# Patient Record
Sex: Male | Born: 1960 | Race: White | Hispanic: No | Smoking: Current every day smoker
Health system: Southern US, Community
[De-identification: ages and names within clinical notes are randomized; demographics above are authoritative.]

## PROBLEM LIST (undated history)

## (undated) DIAGNOSIS — J449 Chronic obstructive pulmonary disease, unspecified: Secondary | ICD-10-CM

## (undated) DIAGNOSIS — E162 Hypoglycemia, unspecified: Secondary | ICD-10-CM

## (undated) HISTORY — PX: TONSILLECTOMY: SUR1361

## (undated) HISTORY — PX: KNEE SURGERY: SHX244

## (undated) HISTORY — DX: Chronic obstructive pulmonary disease, unspecified: J44.9

## (undated) HISTORY — PX: BACK SURGERY: SHX140

---

## 2006-07-31 ENCOUNTER — Emergency Department: Payer: Self-pay | Admitting: Emergency Medicine

## 2008-06-18 ENCOUNTER — Emergency Department: Payer: Self-pay | Admitting: Emergency Medicine

## 2010-07-17 ENCOUNTER — Emergency Department: Payer: Self-pay | Admitting: Emergency Medicine

## 2013-02-23 ENCOUNTER — Emergency Department: Payer: Self-pay | Admitting: Emergency Medicine

## 2014-10-17 ENCOUNTER — Emergency Department
Admit: 2014-10-17 | Disposition: A | Discharge status: Home / Self Care | Payer: Self-pay | Admitting: Emergency Medicine

## 2015-07-26 ENCOUNTER — Emergency Department: Payer: Self-pay

## 2015-07-26 ENCOUNTER — Emergency Department
Admission: EM | Admit: 2015-07-26 | Discharge: 2015-07-26 | Disposition: A | Discharge status: Home / Self Care | Payer: Self-pay | Attending: Emergency Medicine | Admitting: Emergency Medicine

## 2015-07-26 ENCOUNTER — Encounter: Payer: Self-pay | Admitting: Emergency Medicine

## 2015-07-26 DIAGNOSIS — J0100 Acute maxillary sinusitis, unspecified: Secondary | ICD-10-CM | POA: Insufficient documentation

## 2015-07-26 DIAGNOSIS — Z720 Tobacco use: Secondary | ICD-10-CM

## 2015-07-26 DIAGNOSIS — J209 Acute bronchitis, unspecified: Secondary | ICD-10-CM | POA: Insufficient documentation

## 2015-07-26 DIAGNOSIS — Z880 Allergy status to penicillin: Secondary | ICD-10-CM | POA: Insufficient documentation

## 2015-07-26 DIAGNOSIS — F17200 Nicotine dependence, unspecified, uncomplicated: Secondary | ICD-10-CM | POA: Insufficient documentation

## 2015-07-26 LAB — CBC WITH DIFFERENTIAL/PLATELET
Basophils Absolute: 0 10*3/uL (ref 0–0.1)
Basophils Relative: 1 %
EOS ABS: 0.1 10*3/uL (ref 0–0.7)
Eosinophils Relative: 2 %
HEMATOCRIT: 45.6 % (ref 40.0–52.0)
HEMOGLOBIN: 15.1 g/dL (ref 13.0–18.0)
LYMPHS ABS: 1.2 10*3/uL (ref 1.0–3.6)
LYMPHS PCT: 18 %
MCH: 31.4 pg (ref 26.0–34.0)
MCHC: 33.2 g/dL (ref 32.0–36.0)
MCV: 94.8 fL (ref 80.0–100.0)
MONOS PCT: 13 %
Monocytes Absolute: 0.8 10*3/uL (ref 0.2–1.0)
NEUTROS ABS: 4.4 10*3/uL (ref 1.4–6.5)
NEUTROS PCT: 66 %
Platelets: 330 10*3/uL (ref 150–440)
RBC: 4.81 MIL/uL (ref 4.40–5.90)
RDW: 12.7 % (ref 11.5–14.5)
WBC: 6.5 10*3/uL (ref 3.8–10.6)

## 2015-07-26 MED ORDER — FLUTICASONE PROPIONATE 50 MCG/ACT NA SUSP: 1.0000 | Freq: Two times a day (BID) | NASAL | Status: DC

## 2015-07-26 MED ORDER — ALBUTEROL SULFATE HFA 108 (90 BASE) MCG/ACT IN AERS: 2.0000 | INHALATION_SPRAY | Freq: Four times a day (QID) | RESPIRATORY_TRACT | Status: DC | PRN

## 2015-07-26 MED ORDER — AZITHROMYCIN 250 MG PO TABS
ORAL_TABLET | ORAL | Status: AC
Start: 2015-07-26 — End: 2015-07-31

## 2015-07-26 NOTE — ED Provider Notes (Signed)
CSN: [id number]     Arrival date & time 07/26/15  1140 History   First MD Initiated Contact with Patient 07/26/15 1204     Chief Complaint  Patient presents with  . Cough     (Consider location/radiation/quality/duration/timing/severity/associated sxs/prior Treatment) HPI Comments: This is a WDWN 54yo male, NAD, cachectic. Has had cough and chest congestion for about a week. Some off and on shortness of breath. Has been taking OTC Theraflu and Alka Seltzer with relief in the beginning but notes symptoms worsened over the last few days.  Has had sore throat, post nasal drainage. Notes sinus pressure and congestion. Is a daily tobacco smoker and has been doing so "for a long time". Notes he has lost weight recently but attributes that to the loss of his wife recently to COPD. Has had decreased appetite. Last CXR was 2 years ago. Has a history of pneumonia but notes symptoms are usually worse than he has now. Denies fevers, chills, aches.   Patient is a 54 y.o. male presenting with cough. The history is provided by the patient.  Cough Cough characteristics:  Productive Sputum characteristics:  Yellow and brown Severity:  Moderate Onset quality:  Gradual Duration:  1 week Timing:  Intermittent Progression:  Worsening Chronicity:  New Smoker: yes   Relieved by:  Nothing Associated symptoms: shortness of breath and sore throat   Associated symptoms: no chest pain, no chills, no diaphoresis, no ear pain, no eye discharge, no fever, no headaches and no wheezing   Shortness of breath:    Severity:  Mild   Timing:  Intermittent   Progression:  Worsening Sore throat:    Severity:  Mild   Progression:  Waxing and waning   History reviewed. No pertinent past medical history. History reviewed. No pertinent past surgical history. No family history on file. Social History  Substance Use Topics  . Smoking status: Current Every Day Smoker  . Smokeless tobacco: None  . Alcohol Use: No     Review of Systems  Constitutional: Positive for appetite change and unexpected weight change. Negative for fever, chills, diaphoresis and activity change.  HENT: Positive for congestion, postnasal drip and sore throat. Negative for ear discharge, ear pain and facial swelling.   Eyes: Negative for discharge and redness.  Respiratory: Positive for cough and shortness of breath. Negative for chest tightness, wheezing and stridor.   Cardiovascular: Negative for chest pain.  Gastrointestinal: Negative for abdominal pain.  Musculoskeletal: Negative for arthralgias.  Allergic/Immunologic: Negative for environmental allergies.  Neurological: Negative for dizziness and headaches.  Hematological: Negative for adenopathy.      Allergies  Penicillins  Home Medications   Prior to Admission medications   Not on File   BP 121/86 mmHg  Pulse 83  Temp(Src) 97.9 F (36.6 C) (Oral)  Resp 20  Ht  (1.778 m)  Wt 56.7 kg  BMI 17.94 kg/m2  SpO2 95% Physical Exam  Constitutional: He appears well-developed. No distress.  cachectic  HENT:  Head: Normocephalic.  Eyes: Conjunctivae are normal.  Neck: Neck supple.  Cardiovascular: Normal rate, regular rhythm and normal heart sounds.  Exam reveals no gallop and no friction rub.   No murmur heard. Pulmonary/Chest: Effort normal and breath sounds normal. No accessory muscle usage. No tachypnea. No respiratory distress. He has no decreased breath sounds. He has no wheezes. He has no rhonchi. He has no rales.  Lymphadenopathy:    He has no cervical adenopathy.  Neurological: He is alert.  Skin:  Skin is warm and dry.  Nursing note and vitals reviewed.   ED Course  Procedures (no procedures)  Chest xray noted to have no acute cardiopulmomary abnormalities.  CBC without any abnormalities.   MDM Number of Diagnoses or Management Options Acute bronchitis, unspecified organism:  Tobacco abuse:    Hope Pigeon, PA-C 07/26/15  1405  Rockne Menghini, MD 07/26/15 1527

## 2015-07-26 NOTE — Discharge Instructions (Signed)
Acute Bronchitis Bronchitis is when the airways that extend from the windpipe into the lungs get red, puffy, and painful (inflamed). Bronchitis often causes thick spit (mucus) to develop. This leads to a cough. A cough is the most common symptom of bronchitis. In acute bronchitis, the condition usually begins suddenly and goes away over time (usually in 2 weeks). Smoking, allergies, and asthma can make bronchitis worse. Repeated episodes of bronchitis may cause more lung problems. HOME CARE  Rest.  Drink enough fluids to keep your pee (urine) clear or pale yellow (unless you need to limit fluids as told by your doctor).  Only take over-the-counter or prescription medicines as told by your doctor.  Avoid smoking and secondhand smoke. These can make bronchitis worse. If you are a smoker, think about using nicotine gum or skin patches. Quitting smoking will help your lungs heal faster.  Reduce the chance of getting bronchitis again by:  Washing your hands often.  Avoiding people with cold symptoms.  Trying not to touch your hands to your mouth, nose, or eyes.  Follow up with your doctor as told. GET HELP IF: Your symptoms do not improve after 1 week of treatment. Symptoms include:  Cough.  Fever.  Coughing up thick spit.  Body aches.  Chest congestion.  Chills.  Shortness of breath.  Sore throat. GET HELP RIGHT AWAY IF:   You have an increased fever.  You have chills.  You have severe shortness of breath.  You have bloody thick spit (sputum).  You throw up (vomit) often.  You lose too much body fluid (dehydration).  You have a severe headache.  You faint. MAKE SURE YOU:   Understand these instructions.  Will watch your condition.  Will get help right away if you are not doing well or get worse.   This information is not intended to replace advice given to you by your health care provider. Make sure you discuss any questions you have with your health care  provider.   Document Released: 12/10/2007 Document Revised: 02/23/2013 Document Reviewed: 12/14/2012 Elsevier Interactive Patient Education 2016 [employer].  Smoking Cessation, Tips for Success If you are ready to quit smoking, congratulations! You have chosen to help yourself be healthier. Cigarettes bring nicotine, tar, carbon monoxide, and other irritants into your body. Your lungs, heart, and blood vessels will be able to work better without these poisons. There are many different ways to quit smoking. Nicotine gum, nicotine patches, a nicotine inhaler, or nicotine nasal spray can help with physical craving. Hypnosis, support groups, and medicines help break the habit of smoking. WHAT THINGS CAN I DO TO MAKE QUITTING EASIER?  Here are some tips to help you quit for good:  Pick a date when you will quit smoking completely. Tell all of your friends and family about your plan to quit on that date.  Do not try to slowly cut down on the number of cigarettes you are smoking. Pick a quit date and quit smoking completely starting on that day.  Throw away all cigarettes.   Clean and remove all ashtrays from your home, work, and car.  On a card, write down your reasons for quitting. Carry the card with you and read it when you get the urge to smoke.  Cleanse your body of nicotine. Drink enough water and fluids to keep your urine clear or pale yellow. Do this after quitting to flush the nicotine from your body.  Learn to predict your moods. Do not let a  bad situation be your excuse to have a cigarette. Some situations in your life might tempt you into wanting a cigarette.  Never have "just one" cigarette. It leads to wanting another and another. Remind yourself of your decision to quit.  Change habits associated with smoking. If you smoked while driving or when feeling stressed, try other activities to replace smoking. Stand up when drinking your coffee. Brush your teeth after eating. Sit in a  different chair when you read the paper. Avoid alcohol while trying to quit, and try to drink fewer caffeinated beverages. Alcohol and caffeine may urge you to smoke.  Avoid foods and drinks that can trigger a desire to smoke, such as sugary or spicy foods and alcohol.  Ask people who smoke not to smoke around you.  Have something planned to do right after eating or having a cup of coffee. For example, plan to take a walk or exercise.  Try a relaxation exercise to calm you down and decrease your stress. Remember, you may be tense and nervous for the first 2 weeks after you quit, but this will pass.  Find new activities to keep your hands busy. Play with a pen, coin, or rubber band. Doodle or draw things on paper.  Brush your teeth right after eating. This will help cut down on the craving for the taste of tobacco after meals. You can also try mouthwash.   Use oral substitutes in place of cigarettes. Try using lemon drops, carrots, cinnamon sticks, or chewing gum. Keep them handy so they are available when you have the urge to smoke.  When you have the urge to smoke, try deep breathing.  Designate your home as a nonsmoking area.  If you are a heavy smoker, ask your health care provider about a prescription for nicotine chewing gum. It can ease your withdrawal from nicotine.  Reward yourself. Set aside the cigarette money you save and buy yourself something nice.  Look for support from others. Join a support group or smoking cessation program. Ask someone at home or at work to help you with your plan to quit smoking.  Always ask yourself, "Do I need this cigarette or is this just a reflex?" Tell yourself, "Today, I choose not to smoke," or "I do not want to smoke." You are reminding yourself of your decision to quit.  Do not replace cigarette smoking with electronic cigarettes (commonly called e-cigarettes). The safety of e-cigarettes is unknown, and some may contain harmful  chemicals.  If you relapse, do not give up! Plan ahead and think about what you will do the next time you get the urge to smoke. HOW WILL I FEEL WHEN I QUIT SMOKING? You may have symptoms of withdrawal because your body is used to nicotine (the addictive substance in cigarettes). You may crave cigarettes, be irritable, feel very hungry, cough often, get headaches, or have difficulty concentrating. The withdrawal symptoms are only temporary. They are strongest when you first quit but will go away within 10-14 days. When withdrawal symptoms occur, stay in control. Think about your reasons for quitting. Remind yourself that these are signs that your body is healing and getting used to being without cigarettes. Remember that withdrawal symptoms are easier to treat than the major diseases that smoking can cause.  Even after the withdrawal is over, expect periodic urges to smoke. However, these cravings are generally short lived and will go away whether you smoke or not. Do not smoke! WHAT RESOURCES ARE AVAILABLE TO  HELP ME QUIT SMOKING? °Your health care provider can direct you to community resources or hospitals for support, which may include: °· Group support. °· Education. °· Hypnosis. °· Therapy. °  °This information is not intended to replace advice given to you by your health care provider. Make sure you discuss any questions you have with your health care provider. °  °Document Released: 03/21/2004 Document Revised: 07/14/2014 Document Reviewed: 12/09/2012 °Elsevier Interactive Patient Education ©2016 [employer] Inc[employer] ° °Steps to Quit Smoking  °Smoking tobacco can be harmful to your health and can affect almost every organ in your body. Smoking puts you, and those around you, at risk for developing many serious chronic diseases. Quitting smoking is difficult, but it is one of the best things that you can do for your health. It is never too late to quit. °WHAT ARE THE BENEFITS OF QUITTING SMOKING? °When you quit  smoking, you lower your risk of developing serious diseases and conditions, such as: °· Lung cancer or lung disease, such as COPD. °· Heart disease. °· Stroke. °· Heart attack. °· Infertility. °· Osteoporosis and bone fractures. °Additionally, symptoms such as coughing, wheezing, and shortness of breath may get better when you quit. You may also find that you get sick less often because your body is stronger at fighting off colds and infections. If you are pregnant, quitting smoking can help to reduce your chances of having a baby of low birth weight. °HOW DO I GET READY TO QUIT? °When you decide to quit smoking, create a plan to make sure that you are successful. Before you quit: °· Pick a date to quit. Set a date within the next two weeks to give you time to prepare. °· Write down the reasons why you are quitting. Keep this list in places where you will see it often, such as on your bathroom mirror or in your car or wallet. °· Identify the people, places, things, and activities that make you want to smoke (triggers) and avoid them. Make sure to take these actions: °¨ Throw away all cigarettes at home, at work, and in your car. °¨ Throw away smoking accessories, such as ashtrays and lighters. °¨ Clean your car and make sure to empty the ashtray. °¨ Clean your home, including curtains and carpets. °· Tell your family, friends, and coworkers that you are quitting. Support from your loved ones can make quitting easier. °· Talk with your health care provider about your options for quitting smoking. °· Find out what treatment options are covered by your health insurance. °WHAT STRATEGIES CAN I USE TO QUIT SMOKING?  °Talk with your healthcare provider about different strategies to quit smoking. Some strategies include: °· Quitting smoking altogether instead of gradually lessening how much you smoke over a period of time. Research shows that quitting "cold turkey" is more successful than gradually quitting. °· Attending  in-person counseling to help you build problem-solving skills. You are more likely to have success in quitting if you attend several counseling sessions. Even short sessions of 10 minutes can be effective. °· Finding resources and support systems that can help you to quit smoking and remain smoke-free after you quit. These resources are most helpful when you use them often. They can include: °¨ Online chats with a counselor. °¨ Telephone quitlines. °¨ Printed self-help materials. °¨ Support groups or group counseling. °¨ Text messaging programs. °¨ Mobile phone applications. °· Taking medicines to help you quit smoking. (If you are pregnant or breastfeeding, talk with your   health care provider first.) Some medicines contain nicotine and some do not. Both types of medicines help with cravings, but the medicines that include nicotine help to relieve withdrawal symptoms. Your health care provider may recommend: °¨ Nicotine patches, gum, or lozenges. °¨ Nicotine inhalers or sprays. °¨ Non-nicotine medicine that is taken by mouth. °Talk with your health care provider about combining strategies, such as taking medicines while you are also receiving in-person counseling. Using these two strategies together makes you more likely to succeed in quitting than if you used either strategy on its own. °If you are pregnant or breastfeeding, talk with your health care provider about finding counseling or other support strategies to quit smoking. Do not take medicine to help you quit smoking unless told to do so by your health care provider. °WHAT THINGS CAN I DO TO MAKE IT EASIER TO QUIT? °Quitting smoking might feel overwhelming at first, but there is a lot that you can do to make it easier. Take these important actions: °· Reach out to your family and friends and ask that they support and encourage you during this time. Call telephone quitlines, reach out to support groups, or work with a counselor for support. °· Ask people who  smoke to avoid smoking around you. °· Avoid places that trigger you to smoke, such as bars, parties, or smoke-break areas at work. °· Spend time around people who do not smoke. °· Lessen stress in your life, because stress can be a smoking trigger for some people. To lessen stress, try: °¨ Exercising regularly. °¨ Deep-breathing exercises. °¨ Yoga. °¨ Meditating. °¨ Performing a body scan. This involves closing your eyes, scanning your body from head to toe, and noticing which parts of your body are particularly tense. Purposefully relax the muscles in those areas. °· Download or purchase mobile phone or tablet apps (applications) that can help you stick to your quit plan by providing reminders, tips, and encouragement. There are many free apps, such as QuitGuide from the CDC (Centers for Disease Control and Prevention). You can find other support for quitting smoking (smoking cessation) through smokefree.gov and other websites. °HOW WILL I FEEL WHEN I QUIT SMOKING? °Within the first 24 hours of quitting smoking, you may start to feel some withdrawal symptoms. These symptoms are usually most noticeable 2-3 days after quitting, but they usually do not last beyond 2-3 weeks. Changes or symptoms that you might experience include: °· Mood swings. °· Restlessness, anxiety, or irritation. °· Difficulty concentrating. °· Dizziness. °· Strong cravings for sugary foods in addition to nicotine. °· Mild weight gain. °· Constipation. °· Nausea. °· Coughing or a sore throat. °· Changes in how your medicines work in your body. °· A depressed mood. °· Difficulty sleeping (insomnia). °After the first 2-3 weeks of quitting, you may start to notice more positive results, such as: °· Improved sense of smell and taste. °· Decreased coughing and sore throat. °· Slower heart rate. °· Lower blood pressure. °· Clearer skin. °· The ability to breathe more easily. °· Fewer sick days. °Quitting smoking is very challenging for most people. Do  not get discouraged if you are not successful the first time. Some people need to make many attempts to quit before they achieve long-term success. Do your best to stick to your quit plan, and talk with your health care provider if you have any questions or concerns. °  °This information is not intended to replace advice given to you by your health care provider. Make   sure you discuss any questions you have with your health care provider.   Document Released: 06/17/2001 Document Revised: 11/07/2014 Document Reviewed: 11/07/2014 Elsevier Interactive Patient Education [employer]   Please take all medications as prescribed and to completion.   Advise smoking cessation. Please establish care with a local PCP for routine care and follow up.   If any worsening or onset of new symptoms, including but not limited to, fever, chills, chest pain, back pain, fatigue, please return to this emergency department immediately or dial 911.

## 2015-07-26 NOTE — ED Notes (Signed)
Pt presents with cough and congestion x 1 week. Has taken OTC meds with some relief, but no significant change. Pt states his cough is somewhat better today. Pt alert & oriented with NAD noted. No coughing during assessment.

## 2015-07-26 NOTE — ED Notes (Signed)
Cough and congestion for about 1 week 

## 2015-11-13 ENCOUNTER — Emergency Department
Admission: EM | Admit: 2015-11-13 | Discharge: 2015-11-13 | Disposition: A | Discharge status: Home / Self Care | Payer: BLUE CROSS/BLUE SHIELD | Attending: Emergency Medicine | Admitting: Emergency Medicine

## 2015-11-13 ENCOUNTER — Encounter: Payer: Self-pay | Admitting: Emergency Medicine

## 2015-11-13 DIAGNOSIS — M7661 Achilles tendinitis, right leg: Secondary | ICD-10-CM | POA: Insufficient documentation

## 2015-11-13 DIAGNOSIS — F17200 Nicotine dependence, unspecified, uncomplicated: Secondary | ICD-10-CM | POA: Insufficient documentation

## 2015-11-13 DIAGNOSIS — M79671 Pain in right foot: Secondary | ICD-10-CM | POA: Diagnosis present

## 2015-11-13 MED ORDER — NAPROXEN 500 MG PO TABS
500.0000 mg | ORAL_TABLET | Freq: Once | ORAL | Status: AC
  Administered 2015-11-13: 500 mg via ORAL
  Filled 2015-11-13: qty 1

## 2015-11-13 MED ORDER — TRAMADOL HCL 50 MG PO TABS: 50.0000 mg | ORAL_TABLET | Freq: Four times a day (QID) | ORAL | Status: DC | PRN

## 2015-11-13 MED ORDER — TRAMADOL HCL 50 MG PO TABS
50.0000 mg | ORAL_TABLET | Freq: Once | ORAL | Status: AC
  Administered 2015-11-13: 50 mg via ORAL
  Filled 2015-11-13: qty 1

## 2015-11-13 MED ORDER — NAPROXEN 375 MG PO TABS: 375.0000 mg | ORAL_TABLET | Freq: Two times a day (BID) | ORAL | Status: DC

## 2015-11-13 NOTE — ED Notes (Signed)
Feel triage note   Larey SeatFell yesterday conts to have pain to right heel /foot   No deformity noted

## 2015-11-13 NOTE — ED Provider Notes (Signed)
[facility] Emergency Department Provider Note   ____________________________________________  Time seen: Approximately 2:07 PM  I have reviewed the triage vital signs and the nursing notes.   HISTORY  Chief Complaint Foot Pain    HPI Larry BraverStephen W Priestly Jr. is a 55 y.o. male patient complaining of posterior right heel pain 2 days. Patient denies any trauma. Patient state he works as a [employer] prolonged standing. Patient stated pain increases with dorsiflexion of his foot. No palliative measures taken for this complaint. Patient rated his pain as 8/10. Patient described a pain as sharp.   History reviewed. No pertinent past medical history.  There are no active problems to display for this patient.   History reviewed. No pertinent past surgical history.  Current Outpatient Rx  Name  Route  Sig  Dispense  Refill  . albuterol (PROVENTIL HFA;VENTOLIN HFA) 108 (90 Base) MCG/ACT inhaler   Inhalation   Inhale 2 puffs into the lungs every 6 (six) hours as needed for wheezing or shortness of breath.   1 Inhaler   2   . fluticasone (FLONASE) 50 MCG/ACT nasal spray   Each Nare   Place 1 spray into both nostrils 2 (two) times daily.   16 g   0   . naproxen (NAPROSYN) 375 MG tablet   Oral   Take 1 tablet (375 mg total) by mouth 2 (two) times daily with a meal.   60 tablet   2   . traMADol (ULTRAM) 50 MG tablet   Oral   Take 1 tablet (50 mg total) by mouth every 6 (six) hours as needed.   20 tablet   0     Allergies Penicillins  History reviewed. No pertinent family history.  Social History Social History  Substance Use Topics  . Smoking status: Current Every Day Smoker  . Smokeless tobacco: None  . Alcohol Use: 12.0 oz/week    20 Cans of beer per week    Review of Systems Constitutional: No fever/chills Eyes: No visual changes. ENT: No sore throat. Cardiovascular: Denies chest pain. Respiratory: Denies shortness of  breath. Gastrointestinal: No abdominal pain.  No nausea, no vomiting.  No diarrhea.  No constipation. Genitourinary: Negative for dysuria. Musculoskeletal: Left posterior heel pain  Skin: Negative for rash. Neurological: Negative for headaches, focal weakness or numbness. Allergic/Immunilogical: Penicillin ____________________________________________   PHYSICAL EXAM:  VITAL SIGNS: ED Triage Vitals  Enc Vitals Group     BP 11/13/15 1316 128/86 mmHg     Pulse Rate 11/13/15 1316 92     Resp 11/13/15 1316 15     Temp 11/13/15 1316 98.1 F (36.7 C)     Temp Source 11/13/15 1316 Oral     SpO2 11/13/15 1316 97 %     Weight 11/13/15 1316 125 lb (56.7 kg)     Height 11/13/15 1316 5\' 10"  (1.778 m)     Head Cir --      Peak Flow --      Pain Score 11/13/15 1319 8     Pain Loc --      Pain Edu? --      Excl. in GC? --     Constitutional: Alert and oriented. Well appearing and in no acute distress. Eyes: Conjunctivae are normal. PERRL. EOMI. Head: Atraumatic. Nose: No congestion/rhinnorhea. Mouth/Throat: Mucous membranes are moist.  Oropharynx non-erythematous. Neck: No stridor.  No cervical spine tenderness to palpation. Hematological/Lymphatic/Immunilogical: No cervical lymphadenopathy. Cardiovascular: Normal rate, regular rhythm. Grossly normal heart sounds.  Good peripheral circulation. Respiratory: Normal respiratory effort.  No retractions. Lungs CTAB. Gastrointestinal: Soft and nontender. No distention. No abdominal bruits. No CVA tenderness. Musculoskeletal: Obvious deformity of the right heel of foot. Patient has some moderate guarding palpation insertion point of the Achilles. There is some mild edema at the insertion point of the Achilles. Patient does have full nuchal range of motion of the foot increase guarding with dorsiflexion. Neurologic:  Normal speech and language. No gross focal neurologic deficits are appreciated. No gait instability. Skin:  Skin is warm, dry and  intact. No rash noted. Psychiatric: Mood and affect are normal. Speech and behavior are normal.  ____________________________________________   LABS (all labs ordered are listed, but only abnormal results are displayed)  Labs Reviewed - No data to display ____________________________________________  EKG   ____________________________________________  RADIOLOGY   ____________________________________________   PROCEDURES  Procedure(s) performed: None  Critical Care performed: No  ____________________________________________   INITIAL IMPRESSION / ASSESSMENT AND PLAN / ED COURSE  Pertinent labs & imaging results that were available during my care of the patient were reviewed by me and considered in my medical decision making (see chart for details).  Achilles tendinitis. Patient given discharge care instructions. Patient given prescription for naproxen and tramadol. Patient given a work note. [facility]. ____________________________________________   FINAL CLINICAL IMPRESSION(S) / ED DIAGNOSES  Final diagnoses:  Achilles tendinitis of right lower extremity      NEW MEDICATIONS STARTED DURING THIS VISIT:  New Prescriptions   NAPROXEN (NAPROSYN) 375 MG TABLET    Take 1 tablet (375 mg total) by mouth 2 (two) times daily with a meal.   TRAMADOL (ULTRAM) 50 MG TABLET    Take 1 tablet (50 mg total) by mouth every 6 (six) hours as needed.     Note:  This document was prepared using Dragon voice recognition software and may include unintentional dictation errors.    Joni Reining, PA-C 11/13/15 1417  Emily Filbert, MD 11/13/15 [phone]

## 2015-11-13 NOTE — ED Notes (Signed)
Pt presents with pain to right heel that began last night. Pt fell yesterday and but denies obvious trauma or injury to the foot. Pt alert & oriented with NAD noted.

## 2015-11-13 NOTE — ED Notes (Signed)
Discussed discharge instructions, prescriptions, and follow-up care with patient. No questions or concerns at this time. Pt stable at discharge.  

## 2015-11-13 NOTE — Discharge Instructions (Signed)
Achilles Tendinitis Achilles tendinitis is inflammation of the tough, cord-like band that attaches the lower muscles of your leg to your heel (Achilles tendon). It is usually caused by overusing the tendon and joint involved.  CAUSES Achilles tendinitis can happen because of:  A sudden increase in exercise or activity (such as running).  Doing the same exercises or activities (such as jumping) over and over.  Not warming up calf muscles before exercising.  Exercising in shoes that are worn out or not made for exercise.  Having arthritis or a bone growth on the back of the heel bone. This can rub against the tendon and hurt the tendon. SIGNS AND SYMPTOMS The most common symptoms are:  Pain in the back of the leg, just above the heel. The pain usually gets worse with exercise and better with rest.  Stiffness or soreness in the back of the leg, especially in the morning.  Swelling of the skin over the Achilles tendon.  Trouble standing on tiptoe. Sometimes, an Achilles tendon tears (ruptures). Symptoms of an Achilles tendon rupture can include:  Sudden, severe pain in the back of the leg.  Trouble putting weight on the foot or walking normally. DIAGNOSIS Achilles tendinitis will be diagnosed based on symptoms and a physical examination. An X-ray may be done to check if another condition is causing your symptoms. An MRI may be ordered if your health care provider suspects you may have completely torn your tendon, which is called an Achilles tendon rupture.  TREATMENT  Achilles tendinitis usually gets better over time. It can take weeks to months to heal completely. Treatment focuses on treating the symptoms and helping the injury heal. HOME CARE INSTRUCTIONS   Rest your Achilles tendon and avoid activities that cause pain.  Apply ice to the injured area:  Put ice in a plastic bag.  Place a towel between your skin and the bag.  Leave the ice on for 20 minutes, 2-3 times a  day  Try to avoid using the tendon (other than gentle range of motion) while the tendon is painful. Do not resume use until instructed by your health care provider. Then begin use gradually. Do not increase use to the point of pain. If pain does develop, decrease use and continue the above measures. Gradually increase activities that do not cause discomfort until you achieve normal use.  Do exercises to make your calf muscles stronger and more flexible. Your health care provider or physical therapist can recommend exercises for you to do.  Wrap your ankle with an elastic bandage or other wrap. This can help keep your tendon from moving too much. Your health care provider will show you how to wrap your ankle correctly.  Only take over-the-counter or prescription medicines for pain, discomfort, or fever as directed by your health care provider. SEEK MEDICAL CARE IF:   Your pain and swelling increase or pain is uncontrolled with medicines.  You develop new, unexplained symptoms or your symptoms get worse.  You are unable to move your toes or foot.  You develop warmth and swelling in your foot.  You have an unexplained temperature. MAKE SURE YOU:   Understand these instructions.  Will watch your condition.  Will get help right away if you are not doing well or get worse.   This information is not intended to replace advice given to you by your health care provider. Make sure you discuss any questions you have with your health care provider.   Document Released:   04/02/2005 Document Revised: 07/14/2014 Document Reviewed: 02/02/2013 Elsevier Interactive Patient Education 2016 [employer]  

## 2015-12-10 ENCOUNTER — Emergency Department: Payer: BLUE CROSS/BLUE SHIELD

## 2015-12-10 ENCOUNTER — Emergency Department
Admission: EM | Admit: 2015-12-10 | Discharge: 2015-12-10 | Disposition: A | Discharge status: Home / Self Care | Payer: BLUE CROSS/BLUE SHIELD | Attending: Emergency Medicine | Admitting: Emergency Medicine

## 2015-12-10 DIAGNOSIS — Y929 Unspecified place or not applicable: Secondary | ICD-10-CM | POA: Insufficient documentation

## 2015-12-10 DIAGNOSIS — F17200 Nicotine dependence, unspecified, uncomplicated: Secondary | ICD-10-CM | POA: Diagnosis not present

## 2015-12-10 DIAGNOSIS — Y990 Civilian activity done for income or pay: Secondary | ICD-10-CM | POA: Insufficient documentation

## 2015-12-10 DIAGNOSIS — M25562 Pain in left knee: Secondary | ICD-10-CM | POA: Diagnosis present

## 2015-12-10 DIAGNOSIS — S86912A Strain of unspecified muscle(s) and tendon(s) at lower leg level, left leg, initial encounter: Secondary | ICD-10-CM | POA: Insufficient documentation

## 2015-12-10 DIAGNOSIS — W1839XA Other fall on same level, initial encounter: Secondary | ICD-10-CM | POA: Diagnosis not present

## 2015-12-10 DIAGNOSIS — S96912A Strain of unspecified muscle and tendon at ankle and foot level, left foot, initial encounter: Secondary | ICD-10-CM | POA: Diagnosis not present

## 2015-12-10 DIAGNOSIS — Y9389 Activity, other specified: Secondary | ICD-10-CM | POA: Diagnosis not present

## 2015-12-10 DIAGNOSIS — Z7951 Long term (current) use of inhaled steroids: Secondary | ICD-10-CM | POA: Insufficient documentation

## 2015-12-10 MED ORDER — TRAMADOL HCL 50 MG PO TABS: 50.0000 mg | ORAL_TABLET | Freq: Four times a day (QID) | ORAL | Status: DC | PRN

## 2015-12-10 MED ORDER — NAPROXEN 500 MG PO TABS: 500.0000 mg | ORAL_TABLET | Freq: Two times a day (BID) | ORAL | Status: DC

## 2015-12-10 NOTE — Discharge Instructions (Signed)
Ankle Sprain  An ankle sprain is an injury to the strong, fibrous tissues (ligaments) that hold the bones of your ankle joint together.   CAUSES  An ankle sprain is usually caused by a fall or by twisting your ankle. Ankle sprains most commonly occur when you step on the outer edge of your foot, and your ankle turns inward. People who participate in sports are more prone to these types of injuries.   SYMPTOMS    Pain in your ankle. The pain may be present at rest or only when you are trying to stand or walk.   Swelling.   Bruising. Bruising may develop immediately or within 1 to 2 days after your injury.   Difficulty standing or walking, particularly when turning corners or changing directions.  DIAGNOSIS   Your caregiver will ask you details about your injury and perform a physical exam of your ankle to determine if you have an ankle sprain. During the physical exam, your caregiver will press on and apply pressure to specific areas of your foot and ankle. Your caregiver will try to move your ankle in certain ways. An X-ray exam may be done to be sure a bone was not broken or a ligament did not separate from one of the bones in your ankle (avulsion fracture).   TREATMENT   Certain types of braces can help stabilize your ankle. Your caregiver can make a recommendation for this. Your caregiver may recommend the use of medicine for pain. If your sprain is severe, your caregiver may refer you to a surgeon who helps to restore function to parts of your skeletal system (orthopedist) or a physical therapist.  HOME CARE INSTRUCTIONS    Apply ice to your injury for 1-2 days or as directed by your caregiver. Applying ice helps to reduce inflammation and pain.    Put ice in a plastic bag.    Place a towel between your skin and the bag.    Leave the ice on for 15-20 minutes at a time, every 2 hours while you are awake.   Only take over-the-counter or prescription medicines for pain, discomfort, or fever as directed by  your caregiver.   Elevate your injured ankle above the level of your heart as much as possible for 2-3 days.   If your caregiver recommends crutches, use them as instructed. Gradually put weight on the affected ankle. Continue to use crutches or a cane until you can walk without feeling pain in your ankle.   If you have a plaster splint, wear the splint as directed by your caregiver. Do not rest it on anything harder than a pillow for the first 24 hours. Do not put weight on it. Do not get it wet. You may take it off to take a shower or bath.   You may have been given an elastic bandage to wear around your ankle to provide support. If the elastic bandage is too tight (you have numbness or tingling in your foot or your foot becomes cold and blue), adjust the bandage to make it comfortable.   If you have an air splint, you may blow more air into it or let air out to make it more comfortable. You may take your splint off at night and before taking a shower or bath. Wiggle your toes in the splint several times per day to decrease swelling.  SEEK MEDICAL CARE IF:    You have rapidly increasing bruising or swelling.   Your toes feel   extremely cold or you lose feeling in your foot.   Your pain is not relieved with medicine.  SEEK IMMEDIATE MEDICAL CARE IF:   Your toes are numb or blue.   You have severe pain that is increasing.  MAKE SURE YOU:    Understand these instructions.   Will watch your condition.   Will get help right away if you are not doing well or get worse.     This information is not intended to replace advice given to you by your health care provider. Make sure you discuss any questions you have with your health care provider.     Document Released: 06/23/2005 Document Revised: 07/14/2014 Document Reviewed: 07/05/2011  Elsevier Interactive Patient Education 2016 Elsevier Inc[employer]

## 2015-12-10 NOTE — ED Notes (Signed)
Pt not able to void at this time to do WC UDS

## 2015-12-10 NOTE — ED Notes (Signed)
Pt fell off a lift at work and is co left knee and left ankle pain, pt has been ambulatory.

## 2015-12-10 NOTE — ED Notes (Signed)
Patient presents to ED with c/o LEFT knee pain and LEFT ankle pain after falling on a rack at work around 3:30 pm this afternoon. Pt has had hx of 5 knee surgeries in the past. Pt noticed swelling in LEFT ankle tonight. Pt denies chest pain, shortness of breath. Pt alert and oriented, family at bedside.

## 2015-12-10 NOTE — ED Provider Notes (Signed)
[facility] Emergency Department Provider Note ____________________________________________  Time seen: Approximately 10:07 PM  I have reviewed the triage vital signs and the nursing notes.   HISTORY  Chief Complaint Knee Pain    HPI [name] is a 55 y.o. male who presents to the emergency department for evaluation of left knee and left ankle pain. He states he was pulling on something while at work and it "gave away" causing him to fall and strike a rack at work today around 3:30. Pain with ambulation.  No past medical history on file.  There are no active problems to display for this patient.   No past surgical history on file.  Current Outpatient Rx  Name  Route  Sig  Dispense  Refill  . albuterol (PROVENTIL HFA;VENTOLIN HFA) 108 (90 Base) MCG/ACT inhaler   Inhalation   Inhale 2 puffs into the lungs every 6 (six) hours as needed for wheezing or shortness of breath.   1 Inhaler   2   . fluticasone (FLONASE) 50 MCG/ACT nasal spray   Each Nare   Place 1 spray into both nostrils 2 (two) times daily.   16 g   0   . naproxen (NAPROSYN) 500 MG tablet   Oral   Take 1 tablet (500 mg total) by mouth 2 (two) times daily with a meal.   60 tablet   0   . traMADol (ULTRAM) 50 MG tablet   Oral   Take 1 tablet (50 mg total) by mouth every 6 (six) hours as needed.   12 tablet   0     Allergies Penicillins  No family history on file.  Social History Social History  Substance Use Topics  . Smoking status: Current Every Day Smoker  . Smokeless tobacco: Not on file  . Alcohol Use: 12.0 oz/week    20 Cans of beer per week    Review of Systems Constitutional: No recent illness. Cardiovascular: Denies chest pain or palpitations. Respiratory: Denies shortness of breath. Musculoskeletal: Pain in left knee and left ankle. Skin: Negative for rash, wound, lesion. Neurological: Negative for focal weakness or  numbness.  ____________________________________________   PHYSICAL EXAM:  VITAL SIGNS: ED Triage Vitals  Enc Vitals Group     BP 12/10/15 2022 123/80 mmHg     Pulse Rate 12/10/15 2022 79     Resp 12/10/15 2022 18     Temp 12/10/15 2022 97.9 F (36.6 C)     Temp Source 12/10/15 2022 Oral     SpO2 12/10/15 2022 97 %     Weight 12/10/15 2022 125 lb (56.7 kg)     Height 12/10/15 2022 5\' 10"  (1.778 m)     Head Cir --      Peak Flow --      Pain Score 12/10/15 2025 10     Pain Loc --      Pain Edu? --      Excl. in GC? --     Constitutional: Alert and oriented. Well appearing and in no acute distress. Eyes: Conjunctivae are normal. EOMI. Head: Atraumatic. Neck: No stridor.  Respiratory: Normal respiratory effort.   Musculoskeletal: Left knee with healed surgical scar over the anterior patella. No erythema or edema. Tenderness over the lateral aspect with  Palpation. Left ankle without edema or erythema. Tenderness to palpation over the lateral malleolus.  Neurologic:  Normal speech and language. No gross focal neurologic deficits are appreciated. Speech is normal. No gait instability. Skin:  Skin is warm, dry and intact. Atraumatic. Psychiatric: Mood and affect are normal. Speech and behavior are normal.  ____________________________________________   LABS (all labs ordered are listed, but only abnormal results are displayed)  Labs Reviewed - No data to display ____________________________________________  RADIOLOGY  Left knee and ankle negative for acute bony abnormality per radiology. ____________________________________________   PROCEDURES  Procedure(s) performed:   ACE bandage applied to the left ankle. Neurovascularly intact post application.   ____________________________________________   INITIAL IMPRESSION / ASSESSMENT AND PLAN / ED COURSE  Pertinent labs & imaging results that were available during my care of the patient were reviewed by me and  considered in my medical decision making (see chart for details).  Patient is to follow-up with orthopedics for symptoms that are not improving over the week. He was instructed to return to the emergency department for symptoms that change or worsen if he is unable schedule an appointment. ____________________________________________   FINAL CLINICAL IMPRESSION(S) / ED DIAGNOSES  Final diagnoses:  Knee strain, left, initial encounter  Ankle strain, left, initial encounter       Larry Pester, FNP 12/10/15 2302  Larry Filbert, MD 12/10/15 [phone]

## 2016-03-15 DIAGNOSIS — Z791 Long term (current) use of non-steroidal anti-inflammatories (NSAID): Secondary | ICD-10-CM | POA: Diagnosis not present

## 2016-03-15 DIAGNOSIS — S0101XA Laceration without foreign body of scalp, initial encounter: Secondary | ICD-10-CM | POA: Insufficient documentation

## 2016-03-15 DIAGNOSIS — Z7951 Long term (current) use of inhaled steroids: Secondary | ICD-10-CM | POA: Diagnosis not present

## 2016-03-15 DIAGNOSIS — Y999 Unspecified external cause status: Secondary | ICD-10-CM | POA: Diagnosis not present

## 2016-03-15 DIAGNOSIS — W0110XA Fall on same level from slipping, tripping and stumbling with subsequent striking against unspecified object, initial encounter: Secondary | ICD-10-CM | POA: Diagnosis not present

## 2016-03-15 DIAGNOSIS — Y9289 Other specified places as the place of occurrence of the external cause: Secondary | ICD-10-CM | POA: Insufficient documentation

## 2016-03-15 DIAGNOSIS — Z23 Encounter for immunization: Secondary | ICD-10-CM | POA: Insufficient documentation

## 2016-03-15 DIAGNOSIS — Z79899 Other long term (current) drug therapy: Secondary | ICD-10-CM | POA: Diagnosis not present

## 2016-03-15 DIAGNOSIS — F17200 Nicotine dependence, unspecified, uncomplicated: Secondary | ICD-10-CM | POA: Diagnosis not present

## 2016-03-15 DIAGNOSIS — S0990XA Unspecified injury of head, initial encounter: Secondary | ICD-10-CM | POA: Diagnosis present

## 2016-03-15 DIAGNOSIS — Y939 Activity, unspecified: Secondary | ICD-10-CM | POA: Insufficient documentation

## 2016-03-15 DIAGNOSIS — S0181XA Laceration without foreign body of other part of head, initial encounter: Secondary | ICD-10-CM | POA: Diagnosis not present

## 2016-03-16 ENCOUNTER — Emergency Department: Payer: BLUE CROSS/BLUE SHIELD

## 2016-03-16 ENCOUNTER — Encounter: Payer: Self-pay | Admitting: Emergency Medicine

## 2016-03-16 ENCOUNTER — Emergency Department
Admission: EM | Admit: 2016-03-16 | Discharge: 2016-03-16 | Disposition: A | Discharge status: Home / Self Care | Payer: BLUE CROSS/BLUE SHIELD | Attending: Emergency Medicine | Admitting: Emergency Medicine

## 2016-03-16 DIAGNOSIS — IMO0002 Reserved for concepts with insufficient information to code with codable children: Secondary | ICD-10-CM

## 2016-03-16 DIAGNOSIS — S0990XA Unspecified injury of head, initial encounter: Secondary | ICD-10-CM

## 2016-03-16 HISTORY — DX: Hypoglycemia, unspecified: E16.2

## 2016-03-16 MED ORDER — LIDOCAINE-EPINEPHRINE (PF) 1 %-1:200000 IJ SOLN
30.0000 mL | Freq: Once | INTRAMUSCULAR | Status: AC
  Administered 2016-03-16: 30 mL via INTRADERMAL
  Filled 2016-03-16: qty 30

## 2016-03-16 MED ORDER — TETANUS-DIPHTH-ACELL PERTUSSIS 5-2.5-18.5 LF-MCG/0.5 IM SUSP
0.5000 mL | Freq: Once | INTRAMUSCULAR | Status: AC
  Administered 2016-03-16: 0.5 mL via INTRAMUSCULAR
  Filled 2016-03-16: qty 0.5

## 2016-03-16 NOTE — ED Notes (Signed)
Reviewed d/c instructions, follow-up care, and laceration care with pt. Pt verbalized understanding

## 2016-03-16 NOTE — ED Provider Notes (Signed)
[facility] Emergency Department Provider Note  ____________________________________________  Time seen: Approximately 4:20 AM  I have reviewed the triage vital signs and the nursing notes.   HISTORY  Chief Complaint Head Injury and Laceration    HPI Larry BraverStephen W Duford Jr. is a 55 y.o. male who reports tripping over a pair of boots in the living room this morning, hit his face on the ground. No loss of consciousness or neck pain. Otherwise no complaints. He had been drinking tonight which may have contributed. No vomiting.     Past Medical History:  Diagnosis Date  . Hypoglycemia      There are no active problems to display for this patient.    Past Surgical History:  Procedure Laterality Date  . BACK SURGERY    . KNEE SURGERY Left    times 5   . TONSILLECTOMY       Prior to Admission medications   Medication Sig Start Date End Date Taking? Authorizing Provider  albuterol (PROVENTIL HFA;VENTOLIN HFA) 108 (90 Base) MCG/ACT inhaler Inhale 2 puffs into the lungs every 6 (six) hours as needed for wheezing or shortness of breath. 07/26/15   Jami L Hagler, PA-C  fluticasone (FLONASE) 50 MCG/ACT nasal spray Place 1 spray into both nostrils 2 (two) times daily. 07/26/15   Jami L Hagler, PA-C  naproxen (NAPROSYN) 500 MG tablet Take 1 tablet (500 mg total) by mouth 2 (two) times daily with a meal. 12/10/15 12/09/16  Cari B Triplett, FNP  traMADol (ULTRAM) 50 MG tablet Take 1 tablet (50 mg total) by mouth every 6 (six) hours as needed. 12/10/15   Chinita Pesterari B Triplett, FNP     Allergies Penicillins   No family history on file.  Social History Social History  Substance Use Topics  . Smoking status: Current Every Day Smoker  . Smokeless tobacco: Never Used  . Alcohol use 12.0 oz/week    20 Cans of beer per week    Review of Systems  Constitutional:   No fever or chills.  ENT:   No sore throat. No rhinorrhea.No nosebleeds Cardiovascular:   No chest  pain. Respiratory:   No dyspnea or cough. Gastrointestinal:   Negative for abdominal pain, vomiting and diarrhea.  Musculoskeletal:   Negative for focal pain or swelling Neurological:   Negative for headaches 10-point ROS otherwise negative.  ____________________________________________   PHYSICAL EXAM:  VITAL SIGNS: ED Triage Vitals  Enc Vitals Group     BP 03/16/16 0017 104/75     Pulse Rate 03/16/16 0014 78     Resp 03/16/16 0014 18     Temp 03/16/16 0014 97.7 F (36.5 C)     Temp Source 03/16/16 0014 Oral     SpO2 03/16/16 0014 98 %     Weight 03/16/16 0015 120 lb (54.4 kg)     Height 03/16/16 0015 5\' 9"  (1.753 m)     Head Circumference --      Peak Flow --      Pain Score 03/16/16 0015 3     Pain Loc --      Pain Edu? --      Excl. in GC? --     Vital signs reviewed, nursing assessments reviewed.   Constitutional:   Alert and oriented. Well appearing and in no distress. Eyes:   No scleral icterus. No conjunctival pallor. PERRL. EOMI.  No nystagmus. ENT   Head:   NormocephalicWith laceration on the left forehead and left parietal scalp. Also  an abrasion just superior to the eyebrow on the left   Nose:   No congestion/rhinnorhea. No septal hematoma. No epistaxis   Mouth/Throat:   MMM, no pharyngeal erythema. No peritonsillar mass.    Neck:   No stridor. No SubQ emphysema. No meningismus. Full range of motion Hematological/Lymphatic/Immunilogical:   No cervical lymphadenopathy. Cardiovascular:   RRR. Symmetric bilateral radial and DP pulses.  No murmurs.  Respiratory:   Normal respiratory effort without tachypnea nor retractions. Breath sounds are clear and equal bilaterally. No wheezes/rales/rhonchi. Gastrointestinal:   Soft and nontender. Non distended. There is no CVA tenderness.  No rebound, rigidity, or guarding. Genitourinary:   deferred Musculoskeletal:   Nontender with normal range of motion in all extremities. No joint effusions.  No lower  extremity tenderness.  No edema. Neurologic:   Normal speech and language.  CN 2-10 normal. Motor grossly intact. Sober No gross focal neurologic deficits are appreciated.  Skin:    Skin is warm, dry and intact. No rash noted.  No petechiae, purpura, or bullae.  ____________________________________________    LABS (pertinent positives/negatives) (all labs ordered are listed, but only abnormal results are displayed) Labs Reviewed - No data to display ____________________________________________   EKG    ____________________________________________    RADIOLOGY  CT head unremarkable CT cervical spine unremarkable  ____________________________________________   PROCEDURES Procedures LACERATION REPAIR Performed by: Scotty Court, Kouper Spinella Authorized by: Sharman Cheek Consent: Verbal consent obtained. Risks and benefits: risks, benefits and alternatives were discussed Consent given by: patient Patient identity confirmed: provided demographic data Prepped and Draped in normal sterile fashion Wound explored  Laceration Location: Left forehead  Laceration Length: 5cm  No Foreign Bodies seen or palpated  Anesthesia: local infiltration  Local anesthetic: lidocaine 1% with epinephrine  Anesthetic total: 2 ml  Irrigation method: syringe Amount of cleaning: standard  Skin closure: 4-0 Monocryl   Number of sutures: 4  Technique: Simple interrupted   Patient tolerance: Patient tolerated the procedure well with no immediate complications.    LACERATION REPAIR Performed by: Sharman Cheek Authorized by: Sharman Cheek Consent: Verbal consent obtained. Risks and benefits: risks, benefits and alternatives were discussed Consent given by: patient Patient identity confirmed: provided demographic data Prepped and Draped in normal sterile fashion Wound explored  Laceration Location: Left parietal scalp  Laceration Length: 3cm  No Foreign Bodies seen or  palpated  Anesthesia: local infiltration  Local anesthetic: lidocaine 1% with epinephrine  Anesthetic total: 2 ml  Irrigation method: syringe Amount of cleaning: standard  Skin closure: 4-0 Monocryl   Number of sutures: 2  Technique: Simple interrupted   Patient tolerance: Patient tolerated the procedure well with no immediate complications.  ____________________________________________   INITIAL IMPRESSION / ASSESSMENT AND PLAN / ED COURSE  Pertinent labs & imaging results that were available during my care of the patient were reviewed by me and considered in my medical decision making (see chart for details).  Patient well appearing no acute distress. Presents with 2 head lacerations after a fall. tdap updated. Wounds examined cleansed and repaired. No other serious traumatic injuries. Discharge home follow up with primary care.     Clinical Course   ____________________________________________   FINAL CLINICAL IMPRESSION(S) / ED DIAGNOSES  Final diagnoses:  Head injury, initial encounter  Laceration       Portions of this note were generated with dragon dictation software. Dictation errors may occur despite best attempts at proofreading.    Sharman Cheek, MD 03/16/16 [phone]

## 2016-03-16 NOTE — ED Triage Notes (Signed)
Patient reports that he tripped and fell hitting his head. Patient denies LOC. Patient with laceration to forehead, bleeding controlled. Patient denies taking any anticoagulations. Patient states that he has been drinking etoh.

## 2016-03-16 NOTE — ED Notes (Signed)
Pt c/o 3 lacerations to head after fall. Pt reports he fell at approx 2100. Pt reports took ibuprofen at approx 2130.

## 2016-03-16 NOTE — Discharge Instructions (Signed)
Your scalp and forehead lacerations were repaired with absorbable sutures.  These will fall out on their own in about 2 weeks.

## 2016-07-06 ENCOUNTER — Emergency Department
Admission: EM | Admit: 2016-07-06 | Discharge: 2016-07-06 | Disposition: A | Discharge status: Home / Self Care | Payer: BLUE CROSS/BLUE SHIELD | Attending: Emergency Medicine | Admitting: Emergency Medicine

## 2016-07-06 DIAGNOSIS — J111 Influenza due to unidentified influenza virus with other respiratory manifestations: Secondary | ICD-10-CM

## 2016-07-06 DIAGNOSIS — F17200 Nicotine dependence, unspecified, uncomplicated: Secondary | ICD-10-CM | POA: Insufficient documentation

## 2016-07-06 DIAGNOSIS — R509 Fever, unspecified: Secondary | ICD-10-CM | POA: Insufficient documentation

## 2016-07-06 DIAGNOSIS — Z79899 Other long term (current) drug therapy: Secondary | ICD-10-CM | POA: Insufficient documentation

## 2016-07-06 DIAGNOSIS — R52 Pain, unspecified: Secondary | ICD-10-CM | POA: Insufficient documentation

## 2016-07-06 DIAGNOSIS — R69 Illness, unspecified: Secondary | ICD-10-CM

## 2016-07-06 DIAGNOSIS — R05 Cough: Secondary | ICD-10-CM | POA: Insufficient documentation

## 2016-07-06 MED ORDER — GUAIFENESIN-CODEINE 100-10 MG/5ML PO SYRP: 5.0000 mL | ORAL_SOLUTION | Freq: Three times a day (TID) | ORAL | 0 refills | Status: DC | PRN

## 2016-07-06 MED ORDER — NAPROXEN 500 MG PO TABS: 500.0000 mg | ORAL_TABLET | Freq: Two times a day (BID) | ORAL | 0 refills | Status: DC

## 2016-07-06 NOTE — ED Triage Notes (Signed)
Pt states that he started feeling bad on Friday, pt states that he had a fever, bodyaches, cough and congestion

## 2016-07-06 NOTE — ED Provider Notes (Signed)
[facility] Emergency Department Provider Note  ____________________________________________  Time seen: Approximately 9:11 PM  I have reviewed the triage vital signs and the nursing notes.   HISTORY  Chief Complaint Influenza   HPI Larry BraverStephen W Falck Jr. is a 55 y.o. male who presents to the emergency department for evaluation of cough, fever, chills, and body aches that started on Friday. He has taken Tylenol and ibuprofen without relief. He has been unable to work for the past couple of days.   Past Medical History:  Diagnosis Date  . Hypoglycemia     There are no active problems to display for this patient.   Past Surgical History:  Procedure Laterality Date  . BACK SURGERY    . KNEE SURGERY Left    times 5   . TONSILLECTOMY      Prior to Admission medications   Medication Sig Start Date End Date Taking? Authorizing Provider  albuterol (PROVENTIL HFA;VENTOLIN HFA) 108 (90 Base) MCG/ACT inhaler Inhale 2 puffs into the lungs every 6 (six) hours as needed for wheezing or shortness of breath. 07/26/15   Larry L Hagler, PA-C  fluticasone (FLONASE) 50 MCG/ACT nasal spray Place 1 spray into both nostrils 2 (two) times daily. 07/26/15   Larry L Hagler, PA-C  guaiFENesin-codeine (ROBITUSSIN AC) 100-10 MG/5ML syrup Take 5 mLs by mouth 3 (three) times daily as needed for cough. 07/06/16   Larry Pesterari B Autym Siess, FNP  naproxen (NAPROSYN) 500 MG tablet Take 1 tablet (500 mg total) by mouth 2 (two) times daily with a meal. 07/06/16   Larry Faulk B Jowanna Loeffler, FNP  traMADol (ULTRAM) 50 MG tablet Take 1 tablet (50 mg total) by mouth every 6 (six) hours as needed. 12/10/15   Larry Pesterari B Alize Borrayo, FNP    Allergies Penicillins  No family history on file.  Social History Social History  Substance Use Topics  . Smoking status: Current Every Day Smoker  . Smokeless tobacco: Never Used  . Alcohol use 12.0 oz/week    20 Cans of beer per week    Review of Systems Constitutional:  Positive for fever/chills ENT: Negative for sore throat. Cardiovascular: Denies chest pain. Respiratory: Negative for shortness of breath. Positive for cough. Gastrointestinal: Negative for nausea,  negative for vomiting.  Negative for diarrhea.  Musculoskeletal: Positive for body aches Skin: Negative for rash. Neurological: Negative for headaches ____________________________________________   PHYSICAL EXAM:  VITAL SIGNS: ED Triage Vitals [07/06/16 2042]  Enc Vitals Group     BP 133/81     Pulse Rate 96     Resp 18     Temp 98 F (36.7 C)     Temp Source Oral     SpO2 97 %     Weight 130 lb (59 kg)     Height 5\' 10"  (1.778 m)     Head Circumference      Peak Flow      Pain Score 7     Pain Loc      Pain Edu?      Excl. in GC?     Constitutional: Alert and oriented. Well appearing and in no acute distress. Eyes: Conjunctivae are normal. EOMI. Ears: Bilateral tympanic membranes are normal Nose: No congestion; no rhinnorhea. Mouth/Throat: Mucous membranes are moist.  Oropharynx normal. Tonsils appear normal. Neck: No stridor.  Lymphatic: No cervical lymphadenopathy. Cardiovascular: Normal rate, regular rhythm. Grossly normal heart sounds.  Good peripheral circulation. Respiratory: Normal respiratory effort.  No retractions. Breath sounds clear. Gastrointestinal: Soft and  nontender.  Musculoskeletal: FROM x 4 extremities.  Neurologic:  Normal speech and language.  Skin:  Skin is warm, dry and intact. No rash noted. Psychiatric: Mood and affect are normal. Speech and behavior are normal.  ____________________________________________   LABS (all labs ordered are listed, but only abnormal results are displayed)  Labs Reviewed - No data to display ____________________________________________  EKG   ____________________________________________  RADIOLOGY  Not indicated ____________________________________________   PROCEDURES  Procedure(s) performed:  None  Critical Care performed: No  ____________________________________________   INITIAL IMPRESSION / ASSESSMENT AND PLAN / ED COURSE  Clinical Course     Pertinent labs & imaging results that were available during my care of the patient were reviewed by me and considered in my medical decision making (see chart for details).  55 year old male presenting to the ER for fever,cough, and body aches since Friday. He has taken tylenol and ibuprofen for symptoms with minimal relief. He will receive a prescription for Robitussin AC tonight. He was advised to follow up with the PCP of his choice or return to the ER for symptoms that change or worsen.  ____________________________________________   FINAL CLINICAL IMPRESSION(S) / ED DIAGNOSES  Final diagnoses:  Influenza-like illness    Note:  This document was prepared using Dragon voice recognition software and may include unintentional dictation errors.     Larry PesterCari B Martin Belling, FNP 07/06/16 [phone]    Sharyn CreamerMark Quale, MD 07/07/16 [phone]

## 2016-11-01 ENCOUNTER — Emergency Department
Admission: EM | Admit: 2016-11-01 | Discharge: 2016-11-01 | Disposition: A | Discharge status: Home / Self Care | Payer: BLUE CROSS/BLUE SHIELD | Attending: Emergency Medicine | Admitting: Emergency Medicine

## 2016-11-01 ENCOUNTER — Encounter: Payer: Self-pay | Admitting: Emergency Medicine

## 2016-11-01 DIAGNOSIS — R05 Cough: Secondary | ICD-10-CM | POA: Insufficient documentation

## 2016-11-01 DIAGNOSIS — Z5321 Procedure and treatment not carried out due to patient leaving prior to being seen by health care provider: Secondary | ICD-10-CM | POA: Insufficient documentation

## 2016-11-01 DIAGNOSIS — F17210 Nicotine dependence, cigarettes, uncomplicated: Secondary | ICD-10-CM | POA: Insufficient documentation

## 2016-11-01 LAB — CBC
HEMATOCRIT: 47.9 % (ref 40.0–52.0)
Hemoglobin: 16.6 g/dL (ref 13.0–18.0)
MCH: 33.2 pg (ref 26.0–34.0)
MCHC: 34.6 g/dL (ref 32.0–36.0)
MCV: 95.9 fL (ref 80.0–100.0)
PLATELETS: 240 10*3/uL (ref 150–440)
RBC: 5 MIL/uL (ref 4.40–5.90)
RDW: 13.9 % (ref 11.5–14.5)
WBC: 5.7 10*3/uL (ref 3.8–10.6)

## 2016-11-01 LAB — URINALYSIS, COMPLETE (UACMP) WITH MICROSCOPIC
BACTERIA UA: NONE SEEN
Bilirubin Urine: NEGATIVE
Glucose, UA: NEGATIVE mg/dL
Hgb urine dipstick: NEGATIVE
Ketones, ur: NEGATIVE mg/dL
LEUKOCYTES UA: NEGATIVE
Nitrite: NEGATIVE
PH: 5 (ref 5.0–8.0)
Protein, ur: NEGATIVE mg/dL
SPECIFIC GRAVITY, URINE: 1.004 — AB (ref 1.005–1.030)

## 2016-11-01 LAB — COMPREHENSIVE METABOLIC PANEL
ALBUMIN: 4.6 g/dL (ref 3.5–5.0)
ALT: 14 U/L — ABNORMAL LOW (ref 17–63)
AST: 26 U/L (ref 15–41)
Alkaline Phosphatase: 53 U/L (ref 38–126)
Anion gap: 11 (ref 5–15)
BILIRUBIN TOTAL: 0.7 mg/dL (ref 0.3–1.2)
BUN: 9 mg/dL (ref 6–20)
CHLORIDE: 98 mmol/L — AB (ref 101–111)
CO2: 24 mmol/L (ref 22–32)
CREATININE: 0.78 mg/dL (ref 0.61–1.24)
Calcium: 9.6 mg/dL (ref 8.9–10.3)
GFR calc Af Amer: >60 mL/min (ref >60)
GLUCOSE: 130 mg/dL — AB (ref 65–99)
POTASSIUM: 3.8 mmol/L (ref 3.5–5.1)
Sodium: 133 mmol/L — ABNORMAL LOW (ref 135–145)
Total Protein: 7.6 g/dL (ref 6.5–8.1)

## 2016-11-01 LAB — LIPASE, BLOOD: LIPASE: 50 U/L (ref 11–51)

## 2016-11-01 NOTE — ED Notes (Signed)
No answer when called for vital signs 

## 2016-11-01 NOTE — ED Triage Notes (Addendum)
Pt arrived via POV from home with reports of cough for the past month that is clear and dark. States worse in the morning. States he coughs so much he gags.  Pt is a smoker, 1PPD.  Denies fevers.  Some sneezing, watering, itchy eyes. C/o multiple diarrhea episodes that started this morning, watery brown stools. Drinks about 3-4 12 oz beers per day.

## 2016-11-02 ENCOUNTER — Emergency Department: Payer: BLUE CROSS/BLUE SHIELD

## 2016-11-02 ENCOUNTER — Encounter: Payer: Self-pay | Admitting: Emergency Medicine

## 2016-11-02 ENCOUNTER — Emergency Department
Admission: EM | Admit: 2016-11-02 | Discharge: 2016-11-02 | Disposition: A | Discharge status: Home / Self Care | Payer: BLUE CROSS/BLUE SHIELD | Attending: Emergency Medicine | Admitting: Emergency Medicine

## 2016-11-02 DIAGNOSIS — R059 Cough, unspecified: Secondary | ICD-10-CM

## 2016-11-02 DIAGNOSIS — Z79899 Other long term (current) drug therapy: Secondary | ICD-10-CM | POA: Insufficient documentation

## 2016-11-02 DIAGNOSIS — R5383 Other fatigue: Secondary | ICD-10-CM

## 2016-11-02 DIAGNOSIS — J9801 Acute bronchospasm: Secondary | ICD-10-CM

## 2016-11-02 DIAGNOSIS — R05 Cough: Secondary | ICD-10-CM

## 2016-11-02 DIAGNOSIS — F17210 Nicotine dependence, cigarettes, uncomplicated: Secondary | ICD-10-CM | POA: Insufficient documentation

## 2016-11-02 LAB — CBC
HCT: 50.6 % (ref 40.0–52.0)
HEMOGLOBIN: 17.3 g/dL (ref 13.0–18.0)
MCH: 33.1 pg (ref 26.0–34.0)
MCHC: 34.1 g/dL (ref 32.0–36.0)
MCV: 96.8 fL (ref 80.0–100.0)
Platelets: 248 10*3/uL (ref 150–440)
RBC: 5.22 MIL/uL (ref 4.40–5.90)
RDW: 13.6 % (ref 11.5–14.5)
WBC: 5.4 10*3/uL (ref 3.8–10.6)

## 2016-11-02 LAB — BASIC METABOLIC PANEL
Anion gap: 12 (ref 5–15)
BUN: 11 mg/dL (ref 6–20)
CALCIUM: 10 mg/dL (ref 8.9–10.3)
CHLORIDE: 100 mmol/L — AB (ref 101–111)
CO2: 25 mmol/L (ref 22–32)
Creatinine, Ser: 1.12 mg/dL (ref 0.61–1.24)
GFR calc non Af Amer: >60 mL/min (ref >60)
Glucose, Bld: 142 mg/dL — ABNORMAL HIGH (ref 65–99)
Potassium: 4.3 mmol/L (ref 3.5–5.1)
SODIUM: 137 mmol/L (ref 135–145)

## 2016-11-02 LAB — TROPONIN I

## 2016-11-02 MED ORDER — ALBUTEROL SULFATE HFA 108 (90 BASE) MCG/ACT IN AERS: 2.0000 | INHALATION_SPRAY | RESPIRATORY_TRACT | 0 refills | Status: DC | PRN

## 2016-11-02 MED ORDER — IPRATROPIUM-ALBUTEROL 0.5-2.5 (3) MG/3ML IN SOLN
3.0000 mL | Freq: Once | RESPIRATORY_TRACT | Status: AC
  Administered 2016-11-02: 3 mL via RESPIRATORY_TRACT
  Filled 2016-11-02: qty 3

## 2016-11-02 NOTE — ED Notes (Signed)
Presents with cough for 1 month   Now feeling weak  Vomiting yesterday  positve chills

## 2016-11-02 NOTE — ED Notes (Addendum)
Pt reports feeling better since receiving the neb treatment states he is breathing much better and and feels like his airways opened up. DC instructions and follow up information given.  Discussed prescription for albuterol as well.

## 2016-11-02 NOTE — ED Provider Notes (Signed)
[facility] Emergency Department Provider Note ____________________________________________   I have reviewed the triage vital signs and the triage nursing note.  HISTORY  Chief Complaint Cough and Weakness   Historian Patient  HPI Larry Amend. is a 56 y.o. male who is a smoker, states that for the past several weeks he has had some fatigue without any focal weakness. He sought some mild shortness of breath associated with coughing attacks. No history of diagnosed asthma or COPD. He currently does not have a primary care doctor as his doctor retired and he is in between insurance currently.  No chest pain or palpitations.  He came into the ED yesterday, but had to leave due to family issues and chose to come back today for evaluation of fatigue and coughing.  Symptoms are moderate and nothing really makes it worse or better.    Past Medical History:  Diagnosis Date  . Hypoglycemia     There are no active problems to display for this patient.   Past Surgical History:  Procedure Laterality Date  . BACK SURGERY    . KNEE SURGERY Left    times 5   . TONSILLECTOMY      Prior to Admission medications   Medication Sig Start Date End Date Taking? Authorizing Provider  albuterol (PROVENTIL HFA;VENTOLIN HFA) 108 (90 Base) MCG/ACT inhaler Inhale 2 puffs into the lungs every 4 (four) hours as needed for wheezing or shortness of breath. 11/02/16   Governor Rooks, MD  fluticasone (FLONASE) 50 MCG/ACT nasal spray Place 1 spray into both nostrils 2 (two) times daily. 07/26/15   Jami L Hagler, PA-C  guaiFENesin-codeine (ROBITUSSIN AC) 100-10 MG/5ML syrup Take 5 mLs by mouth 3 (three) times daily as needed for cough. 07/06/16   Chinita Pester, FNP  naproxen (NAPROSYN) 500 MG tablet Take 1 tablet (500 mg total) by mouth 2 (two) times daily with a meal. 07/06/16   Cari B Triplett, FNP  traMADol (ULTRAM) 50 MG tablet Take 1 tablet (50 mg total) by mouth every  6 (six) hours as needed. 12/10/15   Chinita Pester, FNP    Allergies  Allergen Reactions  . Mushroom Extract Complex Anaphylaxis  . Penicillins Anaphylaxis    History reviewed. No pertinent family history.  Social History Social History  Substance Use Topics  . Smoking status: Current Every Day Smoker    Packs/day: 1.00    Types: Cigarettes  . Smokeless tobacco: Never Used  . Alcohol use 12.6 oz/week    21 Cans of beer per week    Review of Systems  Constitutional: Negative for fever. Eyes: Negative for visual changes. ENT: Negative for sore throat. Cardiovascular: Negative for chest pain. Respiratory: Positive for shortness of breath when in a cough attack. Gastrointestinal: Negative for abdominal pain, vomiting and diarrhea. Genitourinary: Negative for dysuria. Musculoskeletal: Negative for back pain. Skin: Negative for rash. Neurological: Negative for headache. 10 point Review of Systems otherwise negative ____________________________________________   PHYSICAL EXAM:  VITAL SIGNS: ED Triage Vitals [11/02/16 0739]  Enc Vitals Group     BP (!) 145/94     Pulse Rate (!) 114     Resp 18     Temp 98.9 F (37.2 C)     Temp Source Oral     SpO2 95 %     Weight 125 lb (56.7 kg)     Height  (1.778 m)     Head Circumference      Peak Flow  Pain Score      Pain Loc      Pain Edu?      Excl. in GC?      Constitutional: Alert and oriented. Well appearing and in no distress. HEENT   Head: Normocephalic and atraumatic.      Eyes: Conjunctivae are normal. PERRL. Normal extraocular movements.      Ears:         Nose: No congestion/rhinnorhea.   Mouth/Throat: Mucous membranes are moist.   Neck: No stridor. Cardiovascular/Chest: Normal rate, regular rhythm.  No murmurs, rubs, or gallops. Respiratory: Normal respiratory effort without tachypnea nor retractions. Breath sounds are clear and equal bilaterally. Large deep breath induces coughing. No  obvious wheezing. Gastrointestinal: Soft. No distention, no guarding, no rebound. Nontender.    Genitourinary/rectal:Deferred Musculoskeletal: Nontender with normal range of motion in all extremities. No joint effusions.  No lower extremity tenderness.  No edema. Neurologic:  Normal speech and language. No gross or focal neurologic deficits are appreciated. Skin:  Skin is warm, dry and intact. No rash noted. Psychiatric: Mood and affect are normal. Speech and behavior are normal. Patient exhibits appropriate insight and judgment.   ____________________________________________  LABS (pertinent positives/negatives)  Labs Reviewed  BASIC METABOLIC PANEL - Abnormal; Notable for the following:       Result Value   Chloride 100 (*)    Glucose, Bld 142 (*)    All other components within normal limits  CBC  TROPONIN I    ____________________________________________    EKG I, Governor Rooks, MD, the attending physician have personally viewed and interpreted all ECGs.  94 bpm. Normal sinus rhythm. Incomplete right bundle branch block. Normal axis. Nonspecific ST and T wave, some minimal ST segment depression inferiorly. ____________________________________________  RADIOLOGY All Xrays were viewed by me. Imaging interpreted by Radiologist.  Chest two-view: No edema or consolidation. __________________________________________  PROCEDURES  Procedure(s) performed: None  Critical Care performed: None  ____________________________________________   ED COURSE / ASSESSMENT AND PLAN  Pertinent labs & imaging results that were available during my care of the patient were reviewed by me and considered in my medical decision making (see chart for details).   Larry Lynch is overall well-appearing with stable vital signs. Although he doesn't have resting wheezing, or any takes a deep breath induces coughing which seems consistent with bronchospasm and this patient is a smoker. It sounds  like he's had a little bit of nasal congestion, so possibly viral or allergic inducing of bronchospasm. He was given DuoNeb here as well as will be prescribed albuterol inhaler for home.  In terms of the fatigue, no focal findings to suggest a stroke or intracranial emergency. Screening evaluation here is reassuring with negative for electronic distress, renal failure, anemia. His EKG is a little abnormal, but is not complaining of any chest discomfort is troponins negative.  I will recommend follow up with cardiologist as well.    CONSULTATIONS:   None   Patient / Family / Caregiver informed of clinical course, medical decision-making process, and agree with plan.   I discussed return precautions, follow-up instructions, and discharge instructions with patient and/or family.  Discharge instructions:  You were evaluated for fatigue and cough. As we discussed, your exam and evaluation are overall reassuring in the emergency department stay.  For bronchospasm, I am prescribing albuterol inhaler.  Return to the emergency department immediately for any worsening symptoms including chest pain, palpitations, dizziness, confusion, altered mental status, weakness, numbness, or any other symptoms concerning to  you.  He did need to follow up with a primary care doctor and you are referred to multiple clinics to call and find one that C2.  ___________________________________________   FINAL CLINICAL IMPRESSION(S) / ED DIAGNOSES   Final diagnoses:  Fatigue, unspecified type  Cough  Bronchospasm              Note: This dictation was prepared with Dragon dictation. Any transcriptional errors that result from this process are unintentional    Governor Rooks, MD 11/02/16 [phone]

## 2016-11-02 NOTE — Discharge Instructions (Signed)
You were evaluated for fatigue and cough. As we discussed, your exam and evaluation are overall reassuring in the emergency department stay.  For bronchospasm, I am prescribing albuterol inhaler.  Return to the emergency department immediately for any worsening symptoms including chest pain, palpitations, dizziness, confusion, altered mental status, weakness, numbness, or any other symptoms concerning to you.  You do need to follow up with a primary care doctor and you are referred to multiple clinics to call and find one that suites you - also recommend follow up with a cardiologist.

## 2016-11-02 NOTE — ED Triage Notes (Signed)
Patient reports weakness X 3 days, no energy, and cough X1 month. Productive cough with beige color and foul odor. Patient was seen here yesterday with labs drawn and left before getting a room. No distress noted in triage

## 2017-08-14 ENCOUNTER — Other Ambulatory Visit: Payer: Self-pay

## 2017-08-14 ENCOUNTER — Emergency Department
Admission: EM | Admit: 2017-08-14 | Discharge: 2017-08-17 | Disposition: A | Discharge status: Other Acute Inpatient Hospital | Payer: BLUE CROSS/BLUE SHIELD | Attending: Emergency Medicine | Admitting: Emergency Medicine

## 2017-08-14 DIAGNOSIS — Z79899 Other long term (current) drug therapy: Secondary | ICD-10-CM | POA: Insufficient documentation

## 2017-08-14 DIAGNOSIS — F17210 Nicotine dependence, cigarettes, uncomplicated: Secondary | ICD-10-CM | POA: Insufficient documentation

## 2017-08-14 DIAGNOSIS — R45851 Suicidal ideations: Secondary | ICD-10-CM

## 2017-08-14 DIAGNOSIS — F1010 Alcohol abuse, uncomplicated: Secondary | ICD-10-CM | POA: Insufficient documentation

## 2017-08-14 LAB — URINE DRUG SCREEN, QUALITATIVE (ARMC ONLY)
AMPHETAMINES, UR SCREEN: NOT DETECTED
BENZODIAZEPINE, UR SCRN: NOT DETECTED
Barbiturates, Ur Screen: NOT DETECTED
CANNABINOID 50 NG, UR ~~LOC~~: NOT DETECTED
Cocaine Metabolite,Ur ~~LOC~~: NOT DETECTED
MDMA (ECSTASY) UR SCREEN: NOT DETECTED
Methadone Scn, Ur: NOT DETECTED
Opiate, Ur Screen: NOT DETECTED
PHENCYCLIDINE (PCP) UR S: NOT DETECTED
TRICYCLIC, UR SCREEN: NOT DETECTED

## 2017-08-14 LAB — COMPREHENSIVE METABOLIC PANEL
ALT: 25 U/L (ref 17–63)
AST: 47 U/L — ABNORMAL HIGH (ref 15–41)
Albumin: 5 g/dL (ref 3.5–5.0)
Alkaline Phosphatase: 55 U/L (ref 38–126)
Anion gap: 15 (ref 5–15)
BILIRUBIN TOTAL: 0.6 mg/dL (ref 0.3–1.2)
BUN: 8 mg/dL (ref 6–20)
CHLORIDE: 98 mmol/L — AB (ref 101–111)
CO2: 22 mmol/L (ref 22–32)
Calcium: 9.6 mg/dL (ref 8.9–10.3)
Creatinine, Ser: 0.64 mg/dL (ref 0.61–1.24)
GFR calc non Af Amer: >60 mL/min (ref >60)
Glucose, Bld: 95 mg/dL (ref 65–99)
POTASSIUM: 3.8 mmol/L (ref 3.5–5.1)
Sodium: 135 mmol/L (ref 135–145)
TOTAL PROTEIN: 8.1 g/dL (ref 6.5–8.1)

## 2017-08-14 LAB — CBC
HCT: 47 % (ref 40.0–52.0)
Hemoglobin: 16.3 g/dL (ref 13.0–18.0)
MCH: 33.7 pg (ref 26.0–34.0)
MCHC: 34.7 g/dL (ref 32.0–36.0)
MCV: 97.2 fL (ref 80.0–100.0)
PLATELETS: 238 10*3/uL (ref 150–440)
RBC: 4.84 MIL/uL (ref 4.40–5.90)
RDW: 13.4 % (ref 11.5–14.5)
WBC: 5.2 10*3/uL (ref 3.8–10.6)

## 2017-08-14 LAB — SALICYLATE LEVEL

## 2017-08-14 LAB — ETHANOL: Alcohol, Ethyl (B): 295 mg/dL — ABNORMAL HIGH

## 2017-08-14 LAB — ACETAMINOPHEN LEVEL: Acetaminophen (Tylenol), Serum: <10 ug/mL — ABNORMAL LOW (ref 10–30)

## 2017-08-14 MED ORDER — THIAMINE HCL 100 MG/ML IJ SOLN: 100.0000 mg | Freq: Every day | INTRAMUSCULAR | Status: DC

## 2017-08-14 MED ORDER — VITAMIN B-1 100 MG PO TABS: 100.0000 mg | ORAL_TABLET | Freq: Every day | ORAL | Status: DC

## 2017-08-14 MED ORDER — LORAZEPAM 2 MG/ML IJ SOLN: 0.0000 mg | Freq: Two times a day (BID) | INTRAMUSCULAR | Status: DC

## 2017-08-14 MED ORDER — LORAZEPAM 2 MG PO TABS
0.0000 mg | ORAL_TABLET | Freq: Four times a day (QID) | ORAL | Status: AC
  Administered 2017-08-15 – 2017-08-16 (×2): 1 mg via ORAL
  Filled 2017-08-14: qty 1

## 2017-08-14 MED ORDER — FOLIC ACID 1 MG PO TABS
1.0000 mg | ORAL_TABLET | Freq: Every day | ORAL | Status: DC
  Administered 2017-08-15 – 2017-08-17 (×3): 1 mg via ORAL
  Filled 2017-08-14 (×3): qty 1

## 2017-08-14 MED ORDER — MIRTAZAPINE 15 MG PO TABS
7.5000 mg | ORAL_TABLET | Freq: Every day | ORAL | Status: DC
  Administered 2017-08-15 – 2017-08-16 (×2): 7.5 mg via ORAL
  Filled 2017-08-14 (×2): qty 1

## 2017-08-14 MED ORDER — VITAMIN B-1 100 MG PO TABS
100.0000 mg | ORAL_TABLET | Freq: Every day | ORAL | Status: DC
  Administered 2017-08-15 – 2017-08-17 (×3): 100 mg via ORAL
  Filled 2017-08-14 (×3): qty 1

## 2017-08-14 MED ORDER — LORAZEPAM 2 MG/ML IJ SOLN: 0.0000 mg | Freq: Four times a day (QID) | INTRAMUSCULAR | Status: AC

## 2017-08-14 MED ORDER — LORAZEPAM 2 MG PO TABS
0.0000 mg | ORAL_TABLET | Freq: Two times a day (BID) | ORAL | Status: DC
  Administered 2017-08-17: 2 mg via ORAL
  Filled 2017-08-14: qty 1

## 2017-08-14 NOTE — ED Notes (Signed)

## 2017-08-14 NOTE — ED Notes (Signed)
BEHAVIORAL HEALTH ROUNDING  Patient sleeping: No.  Patient alert and oriented: yes  Behavior appropriate: Yes. ; If no, describe:  Nutrition and fluids offered: Yes  Toileting and hygiene offered: Yes  Sitter present: not applicable, Q 15 min safety rounds and observation.  Law enforcement present: Yes ODS  

## 2017-08-14 NOTE — ED Triage Notes (Signed)
Pt ambulatory to triage with no difficulty. Pt reports he has been feeling suicidal for some time. Pt reports numerous losses over the last few years including the death of his wife and a son. Pt is calm and cooperative at this. Pt does admits to ETOH use today. Pt is here voluntary at this time. Pt denies hx of mental illness.

## 2017-08-14 NOTE — ED Notes (Signed)
Pt. Had no complaint upon arrival to unit.  Pt. Only request was an additional blanket, pt. Given additional blanket.

## 2017-08-14 NOTE — ED Notes (Signed)
BEHAVIORAL HEALTH ROUNDING Patient sleeping: Yes.   Patient alert and oriented: not applicable SLEEPING Behavior appropriate: Yes.  ; If no, describe: SLEEPING Nutrition and fluids offered: No SLEEPING Toileting and hygiene offered: NoSLEEPING Sitter present: not applicable, Q 15 min safety rounds and observation. Law enforcement present: Yes ODS 

## 2017-08-14 NOTE — ED Provider Notes (Addendum)
[facility] Emergency Department Provider Note  Time seen: 8:18 PM  I have reviewed the triage vital signs and the nursing notes.   HISTORY  Chief Complaint Medical Clearance and Suicidal    HPI Larry Lynch. is a 57 y.o. male with a past medical history of alcohol abuse who presents to the emergency department for suicidal ideation.  According to the patient he had been drinking alcohol today, last drink approximately 3 or 4 hours ago.  States increasing depression now with thoughts of killing himself.  States he was having thoughts denied walking into traffic to kill himself so he came to the emergency department for evaluation.  Denies any medical complaints today.  Negative review of systems.   Past Medical History:  Diagnosis Date  . Hypoglycemia     There are no active problems to display for this patient.   Past Surgical History:  Procedure Laterality Date  . BACK SURGERY    . KNEE SURGERY Left    times 5   . TONSILLECTOMY      Prior to Admission medications   Medication Sig Start Date End Date Taking? Authorizing Provider  albuterol (PROVENTIL HFA;VENTOLIN HFA) 108 (90 Base) MCG/ACT inhaler Inhale 2 puffs into the lungs every 4 (four) hours as needed for wheezing or shortness of breath. 11/02/16   Governor Rooks, MD  fluticasone (FLONASE) 50 MCG/ACT nasal spray Place 1 spray into both nostrils 2 (two) times daily. 07/26/15   Hagler, Jami L, PA-C  guaiFENesin-codeine (ROBITUSSIN AC) 100-10 MG/5ML syrup Take 5 mLs by mouth 3 (three) times daily as needed for cough. 07/06/16   Triplett, Rulon Eisenmenger B, FNP  naproxen (NAPROSYN) 500 MG tablet Take 1 tablet (500 mg total) by mouth 2 (two) times daily with a meal. 07/06/16   Triplett, Cari B, FNP  traMADol (ULTRAM) 50 MG tablet Take 1 tablet (50 mg total) by mouth every 6 (six) hours as needed. 12/10/15   Chinita Pester, FNP    Allergies  Allergen Reactions  . Mushroom Extract Complex Anaphylaxis  .  Penicillins Anaphylaxis    No family history on file.  Social History Social History   Tobacco Use  . Smoking status: Current Every Day Smoker    Packs/day: 1.00    Types: Cigarettes  . Smokeless tobacco: Never Used  Substance Use Topics  . Alcohol use: Yes    Alcohol/week: 12.6 oz    Types: 21 Cans of beer per week  . Drug use: No    Review of Systems Constitutional: Negative for fever. Eyes: Negative for visual complaints ENT: Negative for recent illness/congestion Cardiovascular: Negative for chest pain. Respiratory: Negative for shortness of breath. Gastrointestinal: Negative for abdominal pain, vomiting  Genitourinary: Negative for urinary compaints Musculoskeletal: Negative for musculoskeletal complaints Skin: Negative for skin complaints  Neurological: Negative for headache All other ROS negative  ____________________________________________   PHYSICAL EXAM:  VITAL SIGNS: ED Triage Vitals  Enc Vitals Group     BP 08/14/17 1918 125/76     Pulse Rate 08/14/17 1918 86     Resp 08/14/17 1918 18     Temp 08/14/17 1918 97.9 F (36.6 C)     Temp Source 08/14/17 1918 Oral     SpO2 08/14/17 1918 98 %     Weight 08/14/17 1919 130 lb (59 kg)     Height 08/14/17 1919 5\' 9"  (1.753 m)     Head Circumference --      Peak Flow --  Pain Score 08/14/17 1918 8     Pain Loc --      Pain Edu? --      Excl. in GC? --    Constitutional: Alert and oriented. Well appearing and in no distress. Eyes: Normal exam ENT   Head: Normocephalic and atraumatic.   Mouth/Throat: Mucous membranes are moist. Cardiovascular: Normal rate, regular rhythm. No murmur Respiratory: Normal respiratory effort without tachypnea nor retractions. Breath sounds are clear  Gastrointestinal: Soft and nontender. No distention.  Musculoskeletal: Nontender with normal range of motion in all extremities. Neurologic:  Normal speech and language. No gross focal neurologic deficits Skin:  Skin  is warm, dry and intact.  Psychiatric: Suicidal ideation, flat affect.  ____________________________________________   INITIAL IMPRESSION / ASSESSMENT AND PLAN / ED COURSE  Pertinent labs & imaging results that were available during my care of the patient were reviewed by me and considered in my medical decision making (see chart for details).  Patient presents to the emergency department with suicidal ideation alcohol abuse.  Last drink several hours ago.  We will check labs, place the patient under involuntary commitment given active suicidal thoughts.  Overall patient appears well currently, calm, cooperative, was initially sleeping calmly awakens easily able to answer questions appropriately and follow commands.   Psychiatry is seen recommends inpatient admission for the patient.  I have ordered suggested medications.  ____________________________________________   FINAL CLINICAL IMPRESSION(S) / ED DIAGNOSES  Suicidal ideation Alcohol abuse    Minna AntisPaduchowski, Judianne Seiple, MD 08/14/17 2019    Minna AntisPaduchowski, Leisl Spurrier, MD 08/14/17 [phone]

## 2017-08-14 NOTE — ED Notes (Signed)
Pt. Oriented to unit, pt. Advised of camera and 15 minute safety checks.  Pt. Requested and was given extra blanket.

## 2017-08-14 NOTE — ED Notes (Signed)
Report given to SOC MD. Camera in room and pt is understanding of process.  

## 2017-08-15 NOTE — Progress Notes (Signed)
Pt presents guarded / with drawned, tearful on initial assessment. Depressed with flat affect but is pleasant. Denies SI "no ma'am, not right this moment", HI, AVH and pain at this time. Verbally contracts for safety. Per pt "it's just too much, I need help but I don't have insurance, I'm not working so I have no money". "I lost my with in 2016 and I started drinking then, I lost my other son; [place] stay with my son and he drinks more than I do, so I started drinking more". Emotional support and encouragement provided to pt. All medications given as per MD's orders. Safety checks maintained at Q 15 minutes intervals without outburst or self harm gestures. Pt pending placement at this time.

## 2017-08-15 NOTE — ED Notes (Signed)
Pt. States no thoughts of SI or HI, pt. Denies AV/H.  Pt. Maintains good eye contact and appears to be in a good mood.

## 2017-08-15 NOTE — ED Provider Notes (Signed)
-----------------------------------------   6:57 AM on 08/15/2017 -----------------------------------------   Blood pressure 117/69, pulse 86, temperature 97.9 F (36.6 C), temperature source Oral, resp. rate 16, height 5\' 9"  (1.753 m), weight 59 kg (130 lb), SpO2 96 %.  The patient had no acute events since last update.  Calm and cooperative at this time.  Disposition is pending Psychiatry/Behavioral Medicine team recommendations.     Irean HongSung, Leng Montesdeoca J, MD 08/15/17 [phone]

## 2017-08-15 NOTE — ED Notes (Signed)
IVC, pt accepted at [facility], to be transported 2.10.19 after 10am 

## 2017-08-15 NOTE — BH Assessment (Signed)
Patient has been accepted to [facility].  Accepting physician is Dr. Thomas Cornwall.  Call report to [phone].  Representative was Daniel.    ER Staff is aware of it:   Nitcha, ER Sect.;   Dr. Siadecki, ER MD   Olivette, Patient's Nurse   Address: [address] , [place] [address]   Patient bed will ready tomorrow (08/16/2017). Requesting he arrived at approximately 10am. 

## 2017-08-15 NOTE — BH Assessment (Signed)
Assessment Note  Larry BraverStephen W Bingaman Jr. is an 57 y.o. male presenting to the ED voluntarily with police for concerns with worsening depression and suicidal ideations withoutt plan or intent.  Patient reports experiencing increasing thoughts of suicide and worsening depression for the past year, after the death of his wife and son.  He states he has another son who was recently sentenced to prison.   Patient reports he has not sought out grief counseling.  He reports self-medicating with alcohol.  He reports recurring thoughts of walking out in front of traffic but denies wanting to actually carry out these thoughts.  He denies use of any other drugs.  Patient denies any auditory/visual hallucinations.    Diagnosis: Major Depressive Disorder  Past Medical History:  Past Medical History:  Diagnosis Date  . Hypoglycemia     Past Surgical History:  Procedure Laterality Date  . BACK SURGERY    . KNEE SURGERY Left    times 5   . TONSILLECTOMY      Family History: No family history on file.  Social History:  reports that he has been smoking cigarettes.  He has been smoking about 1.00 pack per day. he has never used smokeless tobacco. He reports that he drinks about 12.6 oz of alcohol per week. He reports that he does not use drugs.  Additional Social History:  Alcohol / Drug Use Pain Medications: See PTA Prescriptions: See PTA Over the Counter: See PTA History of alcohol / drug use?: Yes Substance #1 Name of Substance 1: ETOH  CIWA: CIWA-Ar BP: 117/69 Pulse Rate: 86 Nausea and Vomiting: no nausea and no vomiting Tactile Disturbances: none Tremor: no tremor Auditory Disturbances: not present Paroxysmal Sweats: no sweat visible Visual Disturbances: not present Anxiety: no anxiety, at ease Headache, Fullness in Head: none present Agitation: normal activity Orientation and Clouding of Sensorium: oriented and can do serial additions CIWA-Ar Total: 0 COWS:    Allergies:   Allergies  Allergen Reactions  . Mushroom Extract Complex Anaphylaxis  . Penicillins Anaphylaxis    Home Medications:  (Not in a hospital admission)  OB/GYN Status:  No LMP for male patient.  General Assessment Data [facility] Assessment: In system Is this a Tele or Face-to-Face Assessment?: Face-to-Face Is this an Initial Assessment or a Re-assessment for this encounter?: Initial Assessment Marital status: Widowed [place]: n/a Is patient pregnant?: No Pregnancy Status: No Living Arrangements: Alone Can pt return to current living arrangement?: Yes Admission Status: Voluntary Is patient capable of signing voluntary admission?: Yes Referral Source: Self/Family/Friend Insurance type: BCBS     Crisis Care Plan Living Arrangements: Alone Legal Guardian: Other:(self) Name of Psychiatrist: none reported Name of Therapist: none reported  Education Status Is patient currently in school?: No Current Grade: na Highest grade of school patient has completed: na Name of school: na Contact person: na  Risk to self with the past 6 months Suicidal Ideation: Yes-Currently Present Has patient been a risk to self within the past 6 months prior to admission? : No Suicidal Intent: No Has patient had any suicidal intent within the past 6 months prior to admission? : No Is patient at risk for suicide?: No Suicidal Plan?: No Has patient had any suicidal plan within the past 6 months prior to admission? : No Access to Means: No What has been your use of drugs/alcohol within the last 12 months?: 5-6 beer per day Previous Attempts/Gestures: No Triggers for Past Attempts: None known Intentional Self  Injurious Behavior: None Family Suicide History: No Recent stressful life event(s): Loss (Comment)(death of wife and son) Persecutory voices/beliefs?: No Depression: Yes Depression Symptoms: Isolating, Loss of interest in usual pleasures, Feeling  worthless/self pity Substance abuse history and/or treatment for substance abuse?: Yes Suicide prevention information given to non-admitted patients: Not applicable  Risk to Others within the past 6 months Homicidal Ideation: No Does patient have any lifetime risk of violence toward others beyond the six months prior to admission? : No Thoughts of Harm to Others: No Current Homicidal Intent: No Current Homicidal Plan: No Access to Homicidal Means: No Identified Victim: none identified History of harm to others?: No Assessment of Violence: None Noted Does patient have access to weapons?: No Criminal Charges Pending?: No Does patient have a court date: No Is patient on probation?: No  Psychosis Hallucinations: None noted Delusions: None noted  Mental Status Report Appearance/Hygiene: In scrubs Eye Contact: Fair Motor Activity: Freedom of movement Speech: Logical/coherent, Slurred Level of Consciousness: Drowsy Mood: Depressed Affect: Appropriate to circumstance, Depressed Anxiety Level: None Thought Processes: Coherent Judgement: Partial Orientation: Person, Place, Time, Situation Obsessive Compulsive Thoughts/Behaviors: None  Cognitive Functioning Concentration: Normal Memory: Recent Intact, Remote Intact IQ: Average Insight: Fair Impulse Control: Fair Appetite: Fair Sleep: No Change Vegetative Symptoms: None  ADLScreening [facility] Assessment Services) Patient's cognitive ability adequate to safely complete daily activities?: Yes Patient able to express need for assistance with ADLs?: Yes Independently performs ADLs?: Yes (appropriate for developmental age)  Prior Inpatient Therapy Prior Inpatient Therapy: No Prior Therapy Dates: na Prior Therapy Facilty/Provider(s): na Reason for Treatment: na  Prior Outpatient Therapy Prior Outpatient Therapy: No Prior Therapy Dates: na Prior Therapy Facilty/Provider(s): na Reason for Treatment: na Does patient have an ACCT  team?: No Does patient have Intensive In-House Services?  : No Does patient have [facility] services? : No Does patient have P4CC services?: No  ADL Screening (condition at time of admission) Patient's cognitive ability adequate to safely complete daily activities?: Yes Patient able to express need for assistance with ADLs?: Yes Independently performs ADLs?: Yes (appropriate for developmental age)       Abuse/Neglect Assessment (Assessment to be complete while patient is alone) Abuse/Neglect Assessment Can Be Completed: Yes Physical Abuse: Denies Verbal Abuse: Denies Sexual Abuse: Denies Exploitation of patient/patient's resources: Denies Self-Neglect: Denies Values / Beliefs Cultural Requests During Hospitalization: None Spiritual Requests During Hospitalization: None Consults Spiritual Care Consult Needed: No Social Work Consult Needed: No Merchant navy officer (For Healthcare) Does Patient Have a Medical Advance Directive?: No    Additional Information 1:1 In Past 12 Months?: No CIRT Risk: No Elopement Risk: No Does patient have medical clearance?: Yes     Disposition:  Disposition Initial Assessment Completed for this Encounter: Yes Disposition of Patient: Inpatient treatment program Type of inpatient treatment program: Adult  On Site Evaluation by:   Reviewed with Physician:    [place] 08/15/2017 6:50 AM

## 2017-08-15 NOTE — BH Assessment (Addendum)
Referral information for Psychiatric Hospitalization faxed to;   . [facility], No beds and no discharges.  [facility]ressie Ellis. [facility] ([phone]), No beds  . [facility] [phone]),   . [facility] ([phone]--[phone])  . [facility] ([phone]),  . [facility] [phone]),   . [facility] [phone] or [phone])  . [facility] [phone]),   . [facility] [phone]),    [facility] [phone])       . [facility] [phone]),   . [facility] [phone])  . [facility], No beds and no discharges.  [facility]land Kitchen. [facility]

## 2017-08-16 MED ORDER — LORAZEPAM 1 MG PO TABS
ORAL_TABLET | ORAL | Status: AC
  Filled 2017-08-16: qty 1

## 2017-08-16 NOTE — ED Notes (Signed)
IVC, [facility], to be transported 2.10.19 after 10am

## 2017-08-16 NOTE — ED Notes (Signed)
Pt interacting with other patients in dayroom. No behavioral issues. Pt states he is ready to go home. Denies SI. Maintained on 15 minute checks and observation by security camera for safety.

## 2017-08-16 NOTE — ED Provider Notes (Signed)
-----------------------------------------   7:23 AM on 08/16/2017 -----------------------------------------   Blood pressure 125/75, pulse 65, temperature 98.1 F (36.7 C), temperature source Oral, resp. rate 16, height 5\' 9"  (1.753 m), weight 59 kg (130 lb), SpO2 98 %.  The patient had no acute events since last update.  Calm and cooperative at this time.  Reportedly [facility] will [facility].    Irean HongSung, Juanmanuel Marohl J, MD 08/16/17 [phone]

## 2017-08-16 NOTE — BH Assessment (Signed)
[facility] will not be [facility] today due to sheriff's department not having a male officer to transport.   [facility] contacted to advise of issue, Revonda Standardllison stated the bed will be save for tomorrow, 2/11.  BHU Secretary, Thereasa Distanceodney advised Marine scientistand BHU Nurse, Amy advised of update.

## 2017-08-17 NOTE — ED Notes (Signed)
Patient depressed but cooperative during the shift.  Interaction with staff and other patients was appropriate.  No distress noted.  Patient remains under Q15 min checks for safety.

## 2017-08-17 NOTE — ED Notes (Signed)
Sheriff picked up patient for transportation [facility].

## 2017-08-17 NOTE — ED Notes (Signed)
Patient is is alert and oriented x 4.  Denies suicidal thoughts, auditory and visual hallucinations.  Medications given as prescribed.  [facility].  Patient condition stable at time of transfer.  Report given to Nurse.

## 2017-08-17 NOTE — ED Notes (Signed)
EMTALA reviewed. 

## 2017-08-17 NOTE — ED Provider Notes (Signed)
-----------------------------------------   6:59 AM on 08/17/2017 -----------------------------------------   Blood pressure 124/84, pulse 63, temperature 98 F (36.7 C), temperature source Oral, resp. rate 18, height 5\' 9"  (1.753 m), weight 59 kg (130 lb), SpO2 98 %.  The patient had no acute events since last update.  Calm and cooperative at this time.  Disposition is pending Psychiatry/Behavioral Medicine team recommendations.     Merrily Brittleifenbark, Lamondre Wesche, MD 08/17/17 [phone]

## 2017-08-22 IMAGING — CT CT HEAD W/O CM
4 of 8 series · 15 of 47 positions shown, 17 images · non-contrast
Comparison: CT dated 10/17/2014

CLINICAL DATA: 55-year-old male with fall and head injury.

EXAM:
CT HEAD WITHOUT CONTRAST
CT CERVICAL SPINE WITHOUT CONTRAST
TECHNIQUE: Multidetector CT imaging of the head and cervical spine was
performed following the standard protocol without intravenous
contrast. Multiplanar CT image reconstructions of the cervical spine
were also generated.

[Series 3: head bone · axial · 0.47mm/px · z∈[-131,-89]mm · 3 of 75 slices shown]
[im 11/75  bone]
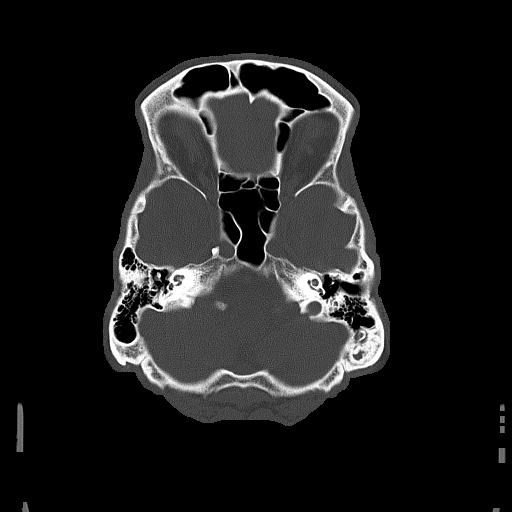
[im 22/75  bone]
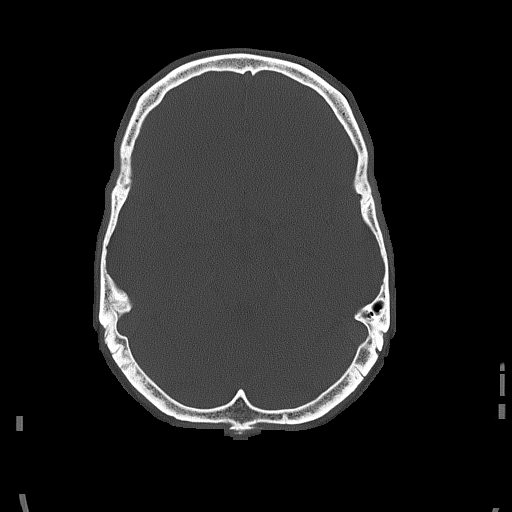
[im 32/75  bone]
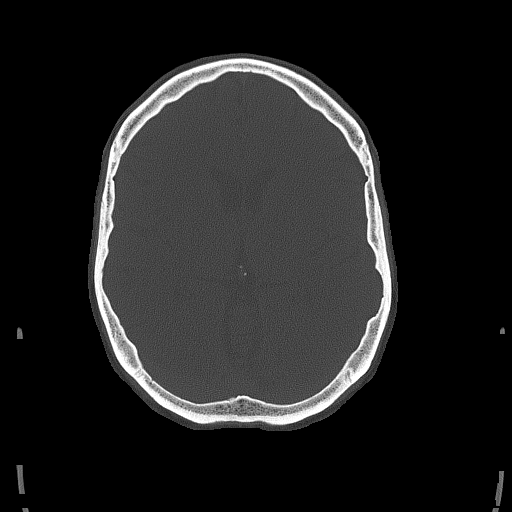

[Series 4: coronal soft tissue · coronal · 0.31mm/px · 3 of 57 slices shown]
[im 15/57  brain]
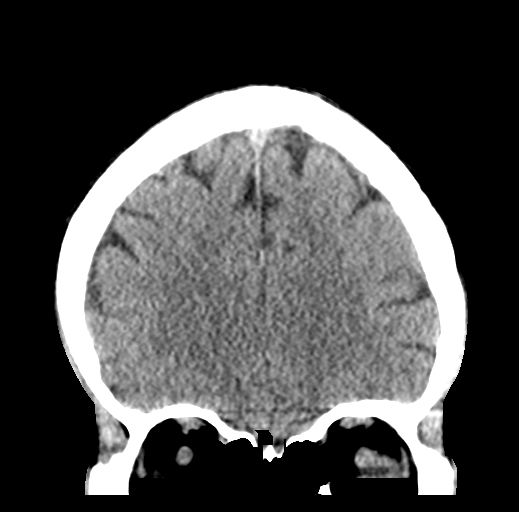
[im 29/57  brain]
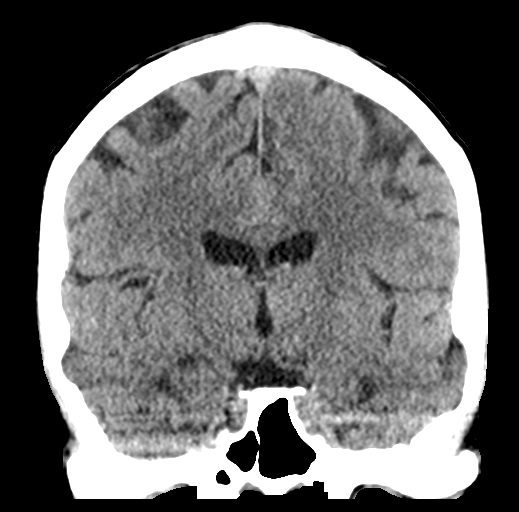
[im 43/57  brain]
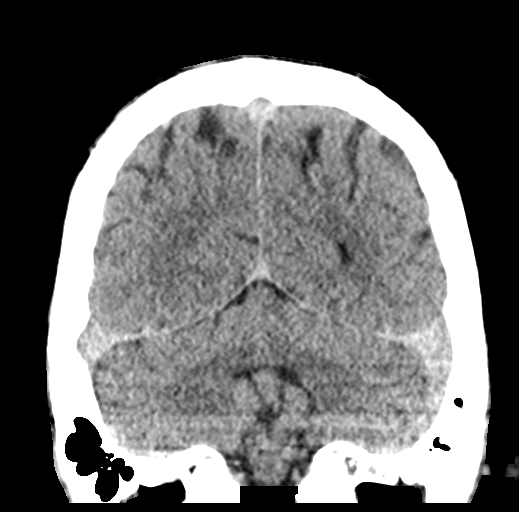

[Series 5: sagittal soft tissue · sagittal · 0.30mm/px · 1 of 48 slices shown]
[im 24/48  brain]
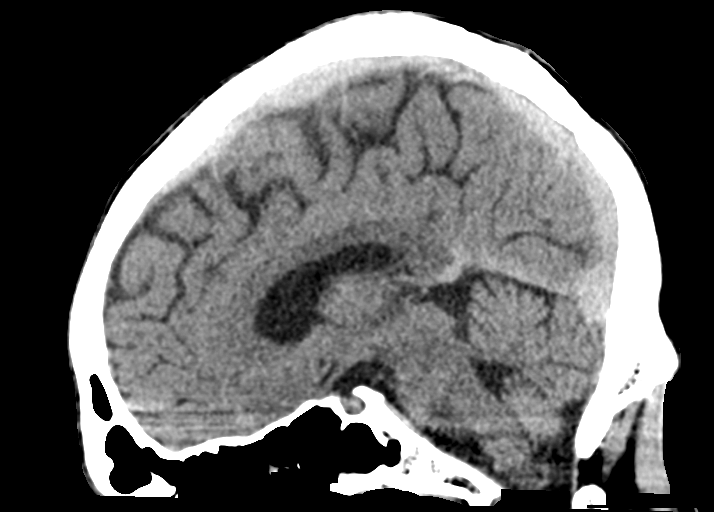

[Series 12: orthogonal bone · axial · 0.28mm/px · z∈[-325,-177]mm · 8 of 100 slices shown, 10 images]
[im 12/100  brain]
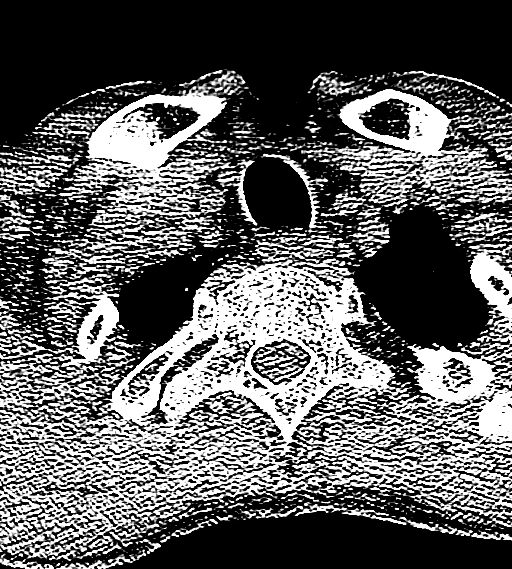
[im 12/100  bone]
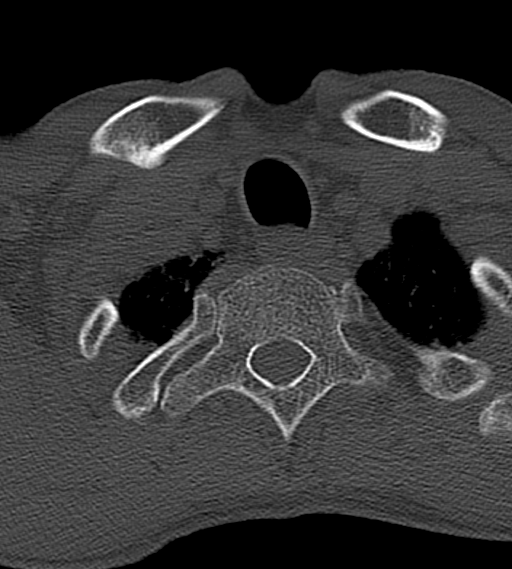
[im 23/100  brain]
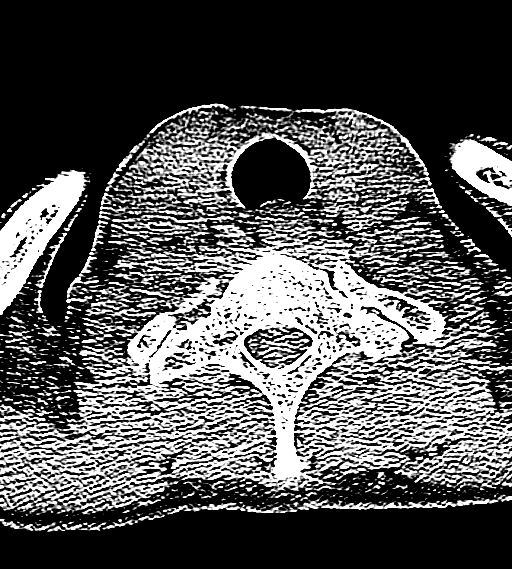
[im 34/100  brain]
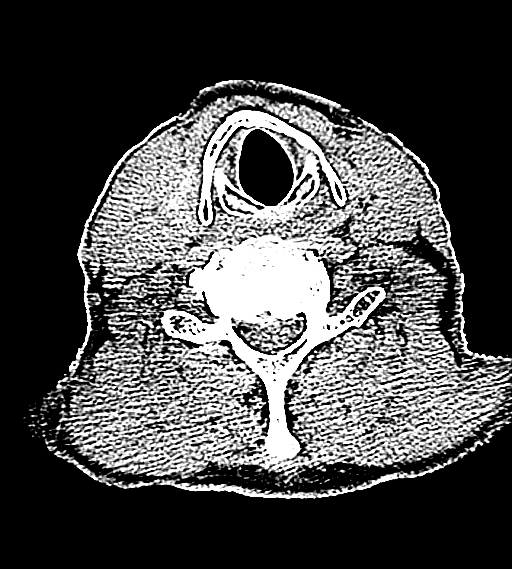
[im 45/100  brain]
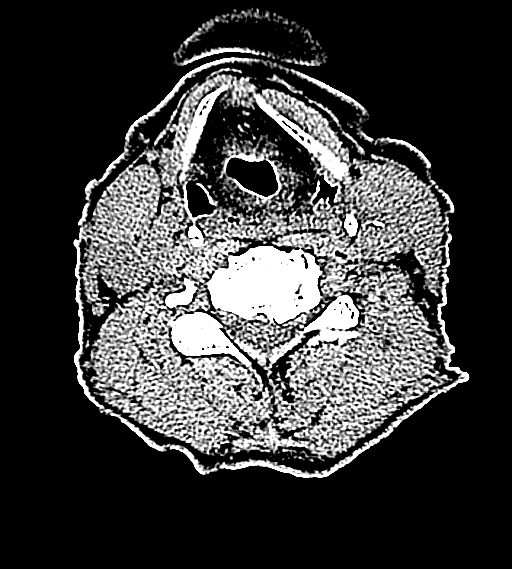
[im 56/100  brain]
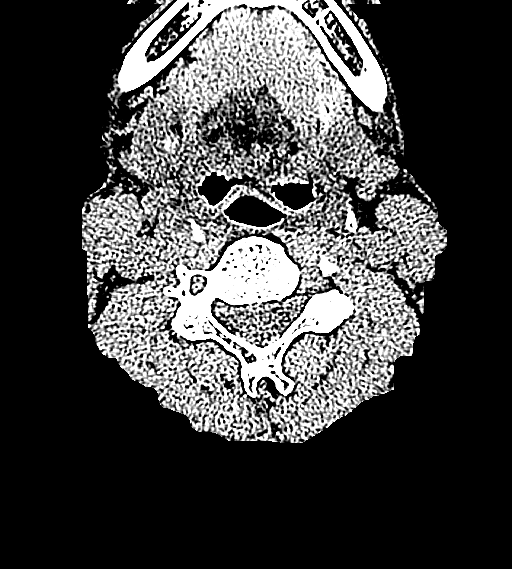
[im 56/100  bone]
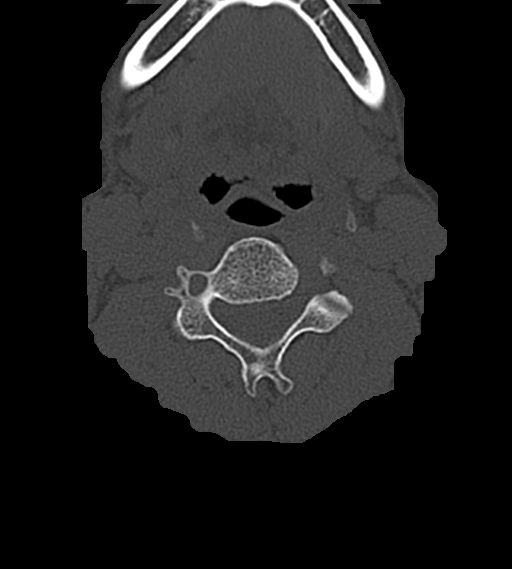
[im 67/100  brain]
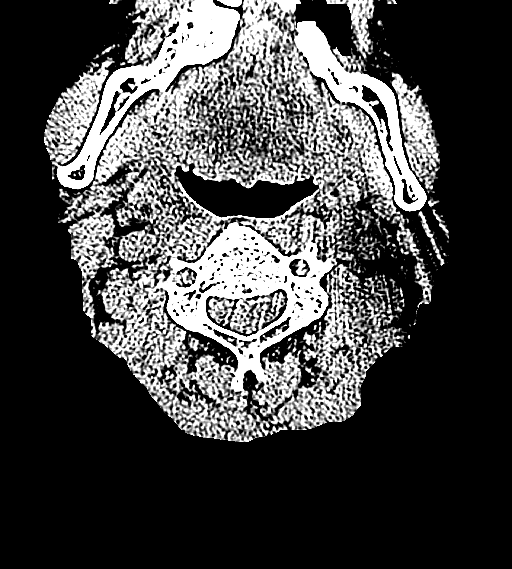
[im 78/100  brain]
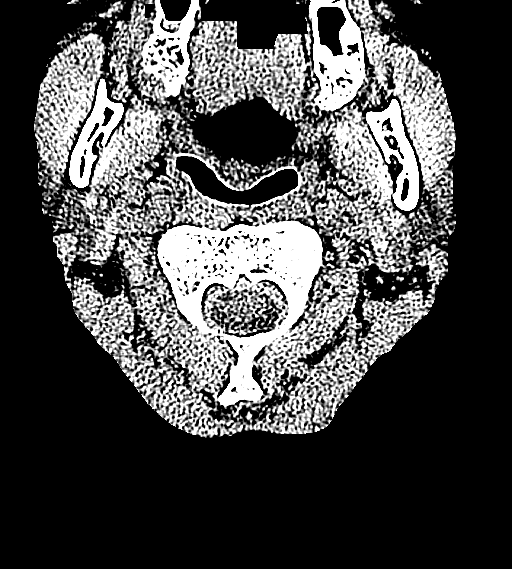
[im 89/100  brain]
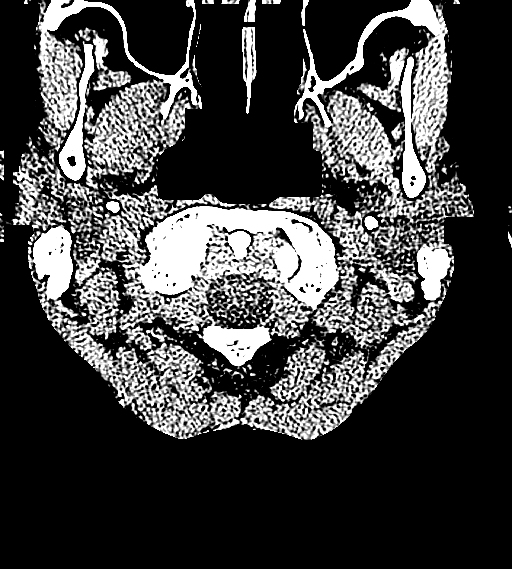

[15 of 47 positions shown; findings below may reference images not displayed]

FINDINGS: CT HEAD FINDINGS

The ventricles and the sulci are appropriate in size for the
patient's age. There is no intracranial hemorrhage. No midline shift
or mass effect identified. The gray-white matter differentiation is
preserved.

There is mild mucoperiosteal thickening of paranasal sinuses. No
air-fluid levels. The mastoid air cells are clear. The calvarium is
intact. There is skin laceration over the left forehead. No
hematoma.

CT CERVICAL SPINE FINDINGS

There is no acute fracture or subluxation of the cervical
spine.There multilevel degenerative changes with facet hypertrophy
most prominent C6-C7. There is apparent associated narrowing of the
neural foramina at this level. There is also mild narrowing of the
central canal at this level. There is loss of normal cervical
lordosis.The odontoid and spinous processes are intact.There is
normal anatomic alignment of the C1-C2 lateral masses. The
visualized soft tissues appear unremarkable.

Mild paraseptal emphysema.
IMPRESSION: No acute intracranial pathology.

No acute/ traumatic cervical spine pathology.

## 2018-04-10 IMAGING — CR DG CHEST 2V
1 series · 2 of 2 positions shown · non-contrast
Comparison: July 26, 2015

CLINICAL DATA: Shortness of breath and cough for 1 month

EXAM:
CHEST  2 VIEW

[Series 1: dg chest 2 view · 0.14mm/px · 2 of 2 slices shown]
[im 1/2]
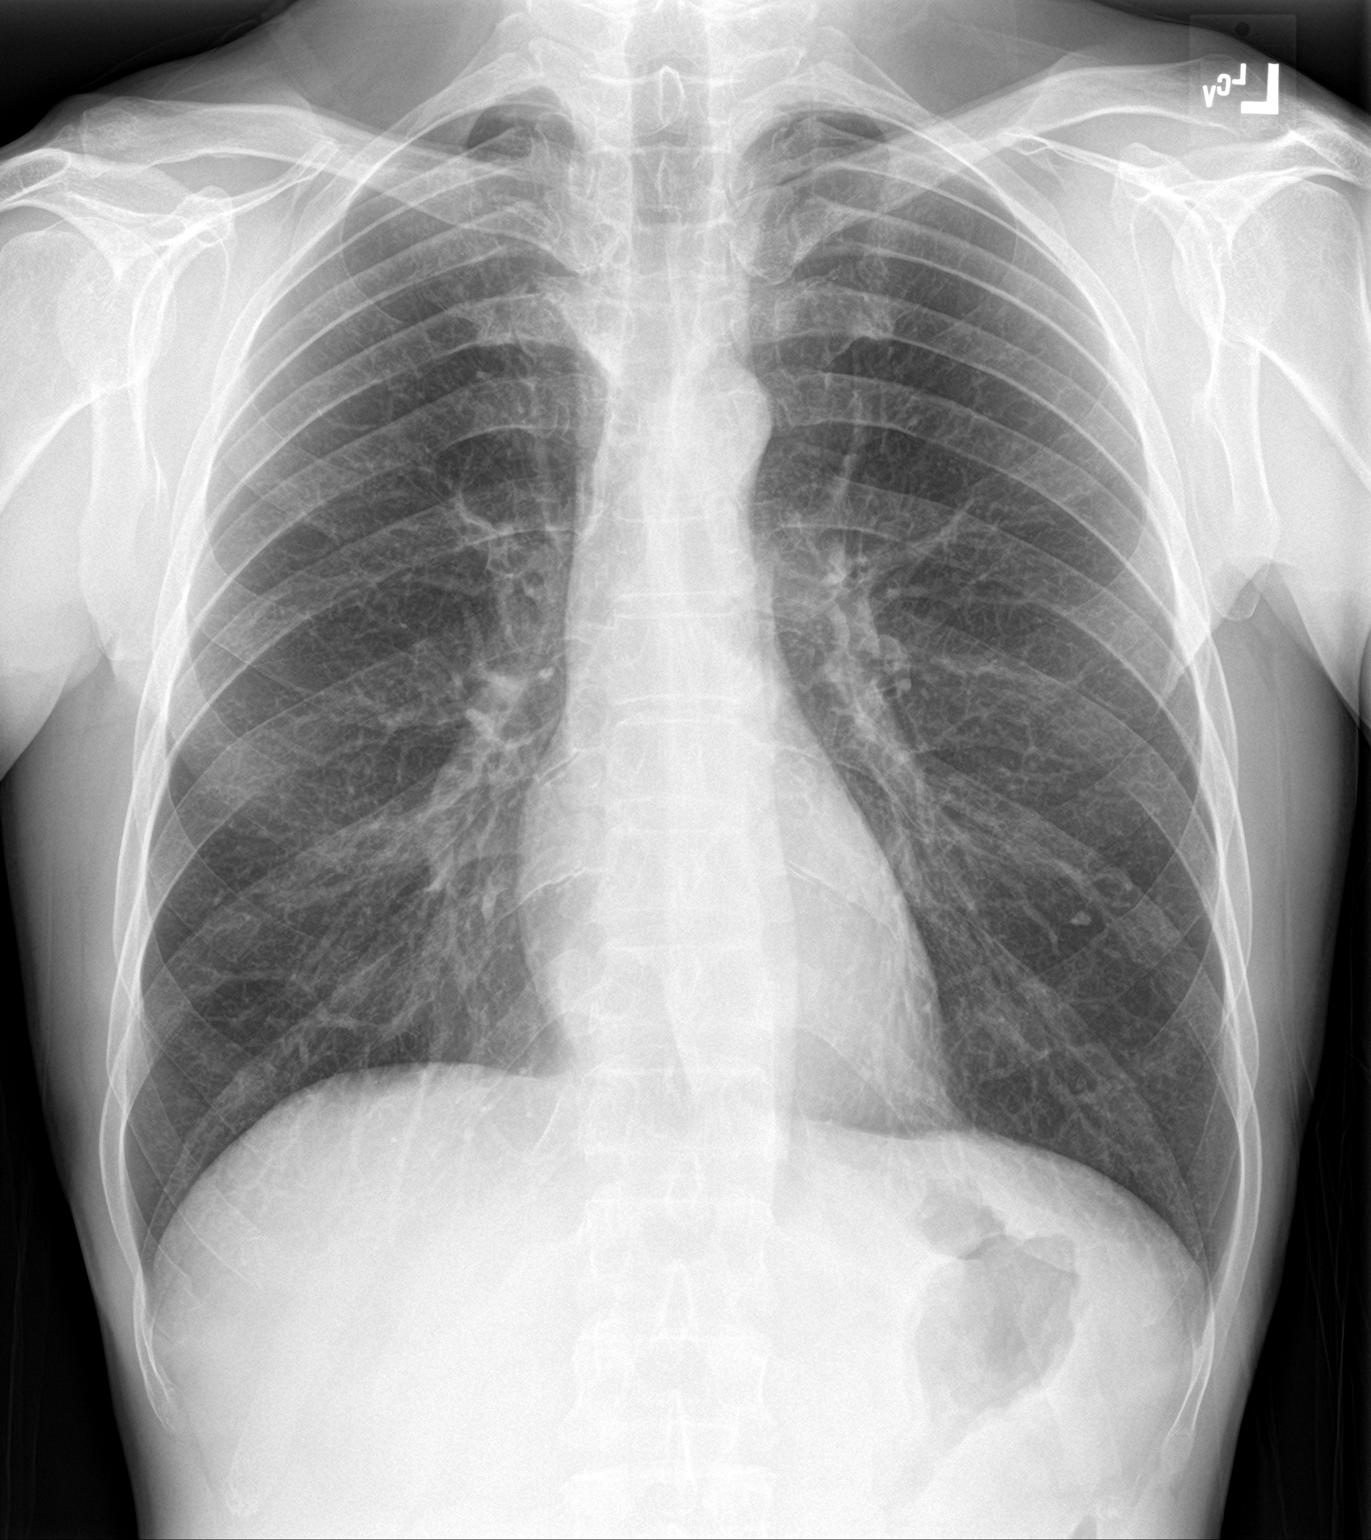
[im 2/2]
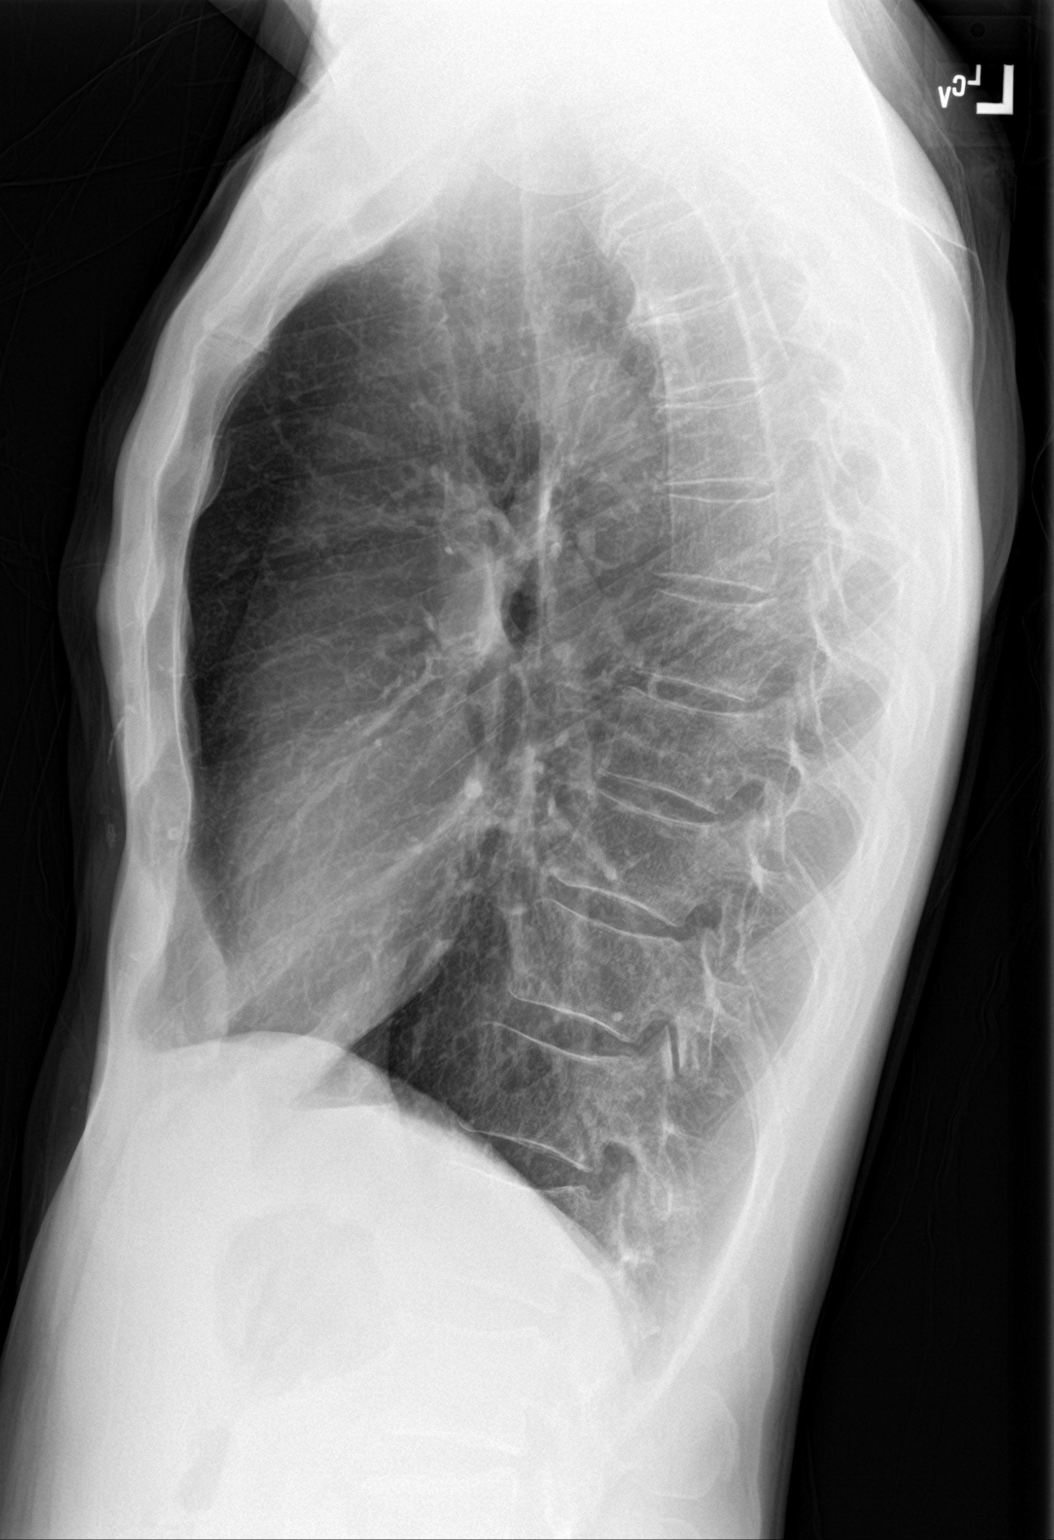

[2 of 2 positions shown; findings below may reference images not displayed]

FINDINGS: There is no edema or consolidation. Heart size and pulmonary
vascularity are normal. No adenopathy. No bone lesions.
IMPRESSION: No edema or consolidation.

## 2018-07-30 ENCOUNTER — Other Ambulatory Visit: Payer: Self-pay

## 2018-07-30 ENCOUNTER — Emergency Department: Payer: Self-pay

## 2018-07-30 ENCOUNTER — Emergency Department
Admission: EM | Admit: 2018-07-30 | Discharge: 2018-07-30 | Disposition: A | Discharge status: Home / Self Care | Payer: Self-pay | Attending: Emergency Medicine | Admitting: Emergency Medicine

## 2018-07-30 DIAGNOSIS — Z79899 Other long term (current) drug therapy: Secondary | ICD-10-CM | POA: Insufficient documentation

## 2018-07-30 DIAGNOSIS — W182XXA Fall in (into) shower or empty bathtub, initial encounter: Secondary | ICD-10-CM | POA: Insufficient documentation

## 2018-07-30 DIAGNOSIS — M25562 Pain in left knee: Secondary | ICD-10-CM | POA: Insufficient documentation

## 2018-07-30 DIAGNOSIS — F17210 Nicotine dependence, cigarettes, uncomplicated: Secondary | ICD-10-CM | POA: Insufficient documentation

## 2018-07-30 MED ORDER — OXYCODONE-ACETAMINOPHEN 5-325 MG PO TABS
1.0000 | ORAL_TABLET | Freq: Once | ORAL | Status: AC
  Administered 2018-07-30: 1 via ORAL
  Filled 2018-07-30: qty 1

## 2018-07-30 MED ORDER — NAPROXEN 500 MG PO TABS
500.0000 mg | ORAL_TABLET | Freq: Once | ORAL | Status: AC
  Administered 2018-07-30: 500 mg via ORAL
  Filled 2018-07-30: qty 1

## 2018-07-30 NOTE — Discharge Instructions (Addendum)
Call orthopedic clinic in 3 days and tell them you follow-up from the emergency room.

## 2018-07-30 NOTE — ED Triage Notes (Addendum)
Pt comes via POV from home with c/o slip and fall. Pt states he was in the shower and just slipped and fell. Pt states he hit his head and left knee. Pt denies any LOC. Pt states he slipped on some shower gel.  Pt not taking any blood thinners. Pt is alert and oriented at this time.  Pt denies any dizziness or weakness.

## 2018-07-30 NOTE — ED Notes (Signed)
See triage note   Presents s/p fall   States slipped in shower   Having pain from left knee and moves into lower leg  Also states he hit his head   No LOC

## 2018-07-30 NOTE — ED Provider Notes (Signed)
[facility] Emergency Department Provider Note   ____________________________________________   First MD Initiated Contact with Patient 07/30/18 1641     (approximate)  I have reviewed the triage vital signs and the nursing notes.   HISTORY  Chief Complaint Fall    HPI Larry Lynch. is a 58 y.o. male patient complain of left knee pain secondary to a slip and fall.  Patient states slipped on gel in the shower and struck his left knee against the wall.  Patient denies LOC.  Patient denies vision disturbance or vertigo.  Patient rates his knee pain as a 9/10.  Patient described the pain is "achy.  Patient stated been multiple surgeries on the left knee.  No palliative measures prior to arrival.    Past Medical History:  Diagnosis Date  . Hypoglycemia     There are no active problems to display for this patient.   Past Surgical History:  Procedure Laterality Date  . BACK SURGERY    . KNEE SURGERY Left    times 5   . TONSILLECTOMY      Prior to Admission medications   Medication Sig Start Date End Date Taking? Authorizing Provider  mirtazapine (REMERON) 30 MG tablet Take 30 mg by mouth at bedtime.   Yes [provider]  albuterol (PROVENTIL HFA;VENTOLIN HFA) 108 (90 Base) MCG/ACT inhaler Inhale 2 puffs into the lungs every 4 (four) hours as needed for wheezing or shortness of breath. 11/02/16   Governor Rooks, MD  fluticasone (FLONASE) 50 MCG/ACT nasal spray Place 1 spray into both nostrils 2 (two) times daily. 07/26/15   Hagler, Jami L, PA-C  guaiFENesin-codeine (ROBITUSSIN AC) 100-10 MG/5ML syrup Take 5 mLs by mouth 3 (three) times daily as needed for cough. 07/06/16   Triplett, Rulon Eisenmenger B, FNP  naproxen (NAPROSYN) 500 MG tablet Take 1 tablet (500 mg total) by mouth 2 (two) times daily with a meal. 07/06/16   Triplett, Cari B, FNP  traMADol (ULTRAM) 50 MG tablet Take 1 tablet (50 mg total) by mouth every 6 (six) hours as needed. 12/10/15    Triplett, Rulon Eisenmenger B, FNP    Allergies Mushroom extract complex and Penicillins  No family history on file.  Social History Social History   Tobacco Use  . Smoking status: Current Every Day Smoker    Packs/day: 1.00    Types: Cigarettes  . Smokeless tobacco: Never Used  Substance Use Topics  . Alcohol use: Yes    Alcohol/week: 21.0 standard drinks    Types: 21 Cans of beer per week  . Drug use: No    Review of Systems Constitutional: No fever/chills Eyes: No visual changes. ENT: No sore throat. Cardiovascular: Denies chest pain. Respiratory: Denies shortness of breath. Gastrointestinal: No abdominal pain.  No nausea, no vomiting.  No diarrhea.  No constipation. Genitourinary: Negative for dysuria. Musculoskeletal: Negative for back pain. Skin: Negative for rash. Neurological: Negative for headaches, focal weakness or numbness. Allergic/Immunilogical: Mushrooms and penicillin.  ____________________________________________   PHYSICAL EXAM:  VITAL SIGNS: ED Triage Vitals [07/30/18 1605]  Enc Vitals Group     BP 128/83     Pulse Rate 84     Resp 18     Temp 98.2 F (36.8 C)     Temp src      SpO2 99 %     Weight 142 lb (64.4 kg)     Height 5\' 11"  (1.803 m)     Head Circumference  Peak Flow      Pain Score 9     Pain Loc      Pain Edu?      Excl. in [place]?    Constitutional: Alert and oriented. Well appearing and in no acute distress. Eyes: Conjunctivae are normal. PERRL. EOMI. Head: Atraumatic. Neck: No cervical spine tenderness to palpation. Hematological/Lymphatic/Immunilogical: No cervical lymphadenopathy. Cardiovascular: Normal rate, regular rhythm. Grossly normal heart sounds.  Good peripheral circulation. Respiratory: Normal respiratory effort.  No retractions. Lungs CTAB. Musculoskeletal: No lower extremity tenderness nor edema.  No joint effusions. Neurologic:  Normal speech and language. No gross focal neurologic deficits are appreciated. No gait  instability. Skin:  Skin is warm, dry and intact. No rash noted. Psychiatric: Mood and affect are normal. Speech and behavior are normal.  ____________________________________________   LABS (all labs ordered are listed, but only abnormal results are displayed)  Labs Reviewed - No data to display ____________________________________________  EKG  ____________________________________________  RADIOLOGY  ED MD interpretation:    Official radiology report(s): Dg Knee 2 Views Left  Result Date: 07/30/2018 CLINICAL DATA:  Left knee pain after falling out of shower today. EXAM: LEFT KNEE - 1-2 VIEW COMPARISON:  December 10, 2015 FINDINGS: Remote fracture of the patella.  No acute fractures or effusions. IMPRESSION: Remote fracture of the patella.  No acute fractures or effusions. Electronically Signed   By: Gerome Sam III M.D   On: 07/30/2018 17:18    ____________________________________________   PROCEDURES  Procedure(s) performed: None  Procedures  Critical Care performed: No  ____________________________________________   INITIAL IMPRESSION / ASSESSMENT AND PLAN / ED COURSE  As part of my medical decision making, I reviewed the following data within the electronic MEDICAL RECORD NUMBER     Left knee pain secondary to contusion and remote fracture.  Discussed x-ray finding with patient showing no acute fracture at this time.  Patient placed in a knee immobilizer and given crutches for ambulation.  Patient given discharge care instruction advised to contact orthopedics to schedule appointment for definitive evaluation.      ____________________________________________   FINAL CLINICAL IMPRESSION(S) / ED DIAGNOSES  Final diagnoses:  Acute pain of left knee     ED Discharge Orders    None       Note:  This document was prepared using Dragon voice recognition software and may include unintentional dictation errors.    Joni Reining, PA-C 07/30/18 1755      Minna Antis, MD 07/30/18 [phone]

## 2019-02-21 ENCOUNTER — Encounter: Payer: Self-pay | Admitting: Emergency Medicine

## 2019-02-21 ENCOUNTER — Emergency Department
Admission: EM | Admit: 2019-02-21 | Discharge: 2019-02-21 | Disposition: A | Discharge status: Home / Self Care | Payer: Self-pay | Attending: Emergency Medicine | Admitting: Emergency Medicine

## 2019-02-21 ENCOUNTER — Emergency Department: Payer: Self-pay

## 2019-02-21 ENCOUNTER — Other Ambulatory Visit: Payer: Self-pay

## 2019-02-21 DIAGNOSIS — S42031A Displaced fracture of lateral end of right clavicle, initial encounter for closed fracture: Secondary | ICD-10-CM | POA: Insufficient documentation

## 2019-02-21 DIAGNOSIS — W01190A Fall on same level from slipping, tripping and stumbling with subsequent striking against furniture, initial encounter: Secondary | ICD-10-CM | POA: Insufficient documentation

## 2019-02-21 DIAGNOSIS — Y929 Unspecified place or not applicable: Secondary | ICD-10-CM | POA: Insufficient documentation

## 2019-02-21 DIAGNOSIS — W19XXXA Unspecified fall, initial encounter: Secondary | ICD-10-CM

## 2019-02-21 DIAGNOSIS — F17210 Nicotine dependence, cigarettes, uncomplicated: Secondary | ICD-10-CM | POA: Insufficient documentation

## 2019-02-21 DIAGNOSIS — S40011A Contusion of right shoulder, initial encounter: Secondary | ICD-10-CM | POA: Insufficient documentation

## 2019-02-21 DIAGNOSIS — Y93K1 Activity, walking an animal: Secondary | ICD-10-CM | POA: Insufficient documentation

## 2019-02-21 DIAGNOSIS — Y999 Unspecified external cause status: Secondary | ICD-10-CM | POA: Insufficient documentation

## 2019-02-21 LAB — CBC
HCT: 41.8 % (ref 39.0–52.0)
Hemoglobin: 14.2 g/dL (ref 13.0–17.0)
MCH: 33.6 pg (ref 26.0–34.0)
MCHC: 34 g/dL (ref 30.0–36.0)
MCV: 98.8 fL (ref 80.0–100.0)
Platelets: 239 10*3/uL (ref 150–400)
RBC: 4.23 MIL/uL (ref 4.22–5.81)
RDW: 13.7 % (ref 11.5–15.5)
WBC: 5.8 10*3/uL (ref 4.0–10.5)
nRBC: 0 % (ref 0.0–0.2)

## 2019-02-21 LAB — COMPREHENSIVE METABOLIC PANEL
ALT: 21 U/L (ref 0–44)
AST: 27 U/L (ref 15–41)
Albumin: 3.9 g/dL (ref 3.5–5.0)
Alkaline Phosphatase: 63 U/L (ref 38–126)
Anion gap: 12 (ref 5–15)
BUN: 6 mg/dL (ref 6–20)
CO2: 22 mmol/L (ref 22–32)
Calcium: 8.8 mg/dL — ABNORMAL LOW (ref 8.9–10.3)
Chloride: 97 mmol/L — ABNORMAL LOW (ref 98–111)
Creatinine, Ser: 0.82 mg/dL (ref 0.61–1.24)
GFR calc Af Amer: >60 mL/min (ref >60)
GFR calc non Af Amer: >60 mL/min (ref >60)
Glucose, Bld: 87 mg/dL (ref 70–99)
Potassium: 3.7 mmol/L (ref 3.5–5.1)
Sodium: 131 mmol/L — ABNORMAL LOW (ref 135–145)
Total Bilirubin: 0.5 mg/dL (ref 0.3–1.2)
Total Protein: 7.1 g/dL (ref 6.5–8.1)

## 2019-02-21 MED ORDER — MELOXICAM 15 MG PO TABS: 15.0000 mg | ORAL_TABLET | Freq: Every day | ORAL | 0 refills | Status: DC

## 2019-02-21 MED ORDER — OXYCODONE-ACETAMINOPHEN 5-325 MG PO TABS: 1.0000 | ORAL_TABLET | Freq: Four times a day (QID) | ORAL | 0 refills | Status: DC | PRN

## 2019-02-21 MED ORDER — OXYCODONE-ACETAMINOPHEN 5-325 MG PO TABS
1.0000 | ORAL_TABLET | Freq: Once | ORAL | Status: AC
  Administered 2019-02-21: 1 via ORAL
  Filled 2019-02-21: qty 1

## 2019-02-21 NOTE — ED Provider Notes (Signed)
[facility] Emergency Department Provider Note  ____________________________________________  Time seen: Approximately 6:49 PM  I have reviewed the triage vital signs and the nursing notes.   HISTORY  Chief Complaint Fall    HPI Larry BiStephen W Jeanella CrazeWilkinson Jr. is a 58 y.o. male who presents the emergency department complaining of shoulder pain after fall yesterday.  Patient was outside with his dog, suffered a mechanical fall falling backwards and striking the posterior right shoulder against a picnic table.  Initially, patient had some pain but was able to move the shoulder and proceeded about his day.  Today area became more painful and he had bruising to the anterior shoulder and chest wall.  Patient did not hit his head or lose consciousness.  He denies any headache, neck pain, chest pain, shortness of breath, abdominal pain, nausea or vomiting.  Patient reports that the pain has limited range of motion to his shoulder.  No medications prior to arrival.  Patient is not on blood thinners.         Past Medical History:  Diagnosis Date  . Hypoglycemia     There are no active problems to display for this patient.   Past Surgical History:  Procedure Laterality Date  . BACK SURGERY    . KNEE SURGERY Left    times 5   . TONSILLECTOMY      Prior to Admission medications   Medication Sig Start Date End Date Taking? Authorizing Provider  albuterol (PROVENTIL HFA;VENTOLIN HFA) 108 (90 Base) MCG/ACT inhaler Inhale 2 puffs into the lungs every 4 (four) hours as needed for wheezing or shortness of breath. 11/02/16   Governor RooksLord, Rebecca, MD  meloxicam (MOBIC) 15 MG tablet Take 1 tablet (15 mg total) by mouth daily. 02/21/19   Early Ord, Delorise RoyalsJonathan D, PA-C  mirtazapine (REMERON) 30 MG tablet Take 30 mg by mouth at bedtime.    [provider]  oxyCODONE-acetaminophen (PERCOCET/ROXICET) 5-325 MG tablet Take 1 tablet by mouth every 6 (six) hours as needed for severe pain.  02/21/19   Varnell Donate, Delorise RoyalsJonathan D, PA-C    Allergies Mushroom extract complex and Penicillins  History reviewed. No pertinent family history.  Social History Social History   Tobacco Use  . Smoking status: Current Every Day Smoker    Packs/day: 1.00    Types: Cigarettes  . Smokeless tobacco: Never Used  Substance Use Topics  . Alcohol use: Yes    Alcohol/week: 21.0 standard drinks    Types: 21 Cans of beer per week  . Drug use: No     Review of Systems  Constitutional: No fever/chills Eyes: No visual changes. No discharge ENT: No upper respiratory complaints. Cardiovascular: no chest pain. Respiratory: no cough. No SOB. Gastrointestinal: No abdominal pain.  No nausea, no vomiting.   Musculoskeletal: Positive for injury to the posterior right shoulder with bruising to the anterior shoulder and chest wall Skin: Negative for rash, abrasions, lacerations, ecchymosis. Neurological: Negative for headaches, focal weakness or numbness. 10-point ROS otherwise negative.  ____________________________________________   PHYSICAL EXAM:  VITAL SIGNS: ED Triage Vitals [02/21/19 1730]  Enc Vitals Group     BP (!) 122/100     Pulse Rate 87     Resp 16     Temp 98.5 F (36.9 C)     Temp Source Oral     SpO2 97 %     Weight 140 lb (63.5 kg)     Height 5\' 10"  (1.778 m)     Head Circumference  Peak Flow      Pain Score 10     Pain Loc      Pain Edu?      Excl. in [place]?      Constitutional: Alert and oriented. Well appearing and in no acute distress. Eyes: Conjunctivae are normal. PERRL. EOMI. Head: Atraumatic. ENT:      Ears:       Nose: No congestion/rhinnorhea.      Mouth/Throat: Mucous membranes are moist.  Neck: No stridor.  No cervical spine tenderness to palpation  Cardiovascular: Normal rate, regular rhythm. Normal S1 and S2.  Good peripheral circulation. Respiratory: Normal respiratory effort without tachypnea or retractions. Lungs CTAB. Good air entry to the  bases with no decreased or absent breath sounds. Musculoskeletal: Limited range of motion to the right shoulder but otherwise full range of motion to all extremities. No gross deformities appreciated.  Visualization of the right shoulder reveals significant ecchymosis along the anterior shoulder extending into the anterior chest wall.  Patient is tender to palpation along the distal aspect of the clavicle, the acromioclavicular joint space, and along the superior aspect of the shoulder and the rotator cuff distribution.  Again, patient has very limited range of motion at this time.  Patient has good passive range of motion to 90 degrees.  Further range of motion at this time was not tested.  Patient is tender to palpation over the scapular spine and inferior aspect of the scapula.  No palpable abnormality or deficits.  Palpation along the biceps, pectoralis muscle reveals no acute deficits concerning for muscle rupture.  Patient is nontender to palpation along the anterior and lateral rib cage.  Good underlying breath sounds bilaterally.  Patient has good pulses in the brachial, radial pulse in the right upper extremity.  Sensation intact all digits right upper extremity. Neurologic:  Normal speech and language. No gross focal neurologic deficits are appreciated.  Skin:  Skin is warm, dry and intact. No rash noted. Psychiatric: Mood and affect are normal. Speech and behavior are normal. Patient exhibits appropriate insight and judgement.   ____________________________________________   LABS (all labs ordered are listed, but only abnormal results are displayed)  Labs Reviewed  COMPREHENSIVE METABOLIC PANEL - Abnormal; Notable for the following components:      Result Value   Sodium 131 (*)    Chloride 97 (*)    Calcium 8.8 (*)    All other components within normal limits  CBC   ____________________________________________  EKG   ____________________________________________  RADIOLOGY I  personally viewed and evaluated these images as part of my medical decision making, as well as reviewing the written report by the radiologist.  Dg Chest 2 View  Result Date: 02/21/2019 CLINICAL DATA:  RIGHT chest pain following fall 2 days ago. Initial encounter. EXAM: CHEST - 2 VIEW COMPARISON:  None. FINDINGS: The cardiomediastinal silhouette is unremarkable. There is no evidence of focal airspace disease, pulmonary edema, suspicious pulmonary nodule/mass, pleural effusion, or pneumothorax. A distal RIGHT clavicle fracture is noted. IMPRESSION: Distal RIGHT clavicle fracture. No evidence of acute cardiopulmonary disease. Electronically Signed   By: Margarette Canada M.D.   On: 02/21/2019 18:12   Dg Shoulder Right  Result Date: 02/21/2019 CLINICAL DATA:  Right side chest pain that extends around proximal humerus to posterior right scapula post fall x 2 days ago Pt states he fell backwards and did a foosh behind back to catch fall x 2 days ago EXAM: RIGHT SHOULDER - 2+ VIEW COMPARISON:  None  FINDINGS: There is a comminuted fracture involving the distal aspect of the clavicle. Fracture does not appear to extend to the articular surface and the acromioclavicular joint appears preserved. The proximal humerus and scapula are normal. IMPRESSION: Comminuted fracture of the distal clavicle. Electronically Signed   By: Norva PavlovElizabeth  Brown M.D.   On: 02/21/2019 18:13    ____________________________________________    PROCEDURES  Procedure(s) performed:    Procedures    Medications  oxyCODONE-acetaminophen (PERCOCET/ROXICET) 5-325 MG per tablet 1 tablet (has no administration in time range)     ____________________________________________   INITIAL IMPRESSION / ASSESSMENT AND PLAN / ED COURSE  Pertinent labs & imaging results that were available during my care of the patient were reviewed by me and considered in my medical decision making (see chart for details).  Review of the [place] CSRS was  performed in accordance of the [facility] prior to dispensing any controlled drugs.           Patient's diagnosis is consistent with fall, clavicle fracture, chest wall bruising.  Patient presented to emergency department complaining of right shoulder pain after fall yesterday.  Patient fell backwards after sustaining a mechanical fall striking his shoulder blade against the picnic table.  Given patient's presentation, initial concern for scapular fracture with underlying vascular injury was present given injury to the posterior shoulder, bruising to the anterior shoulder.  Thankfully, imaging reveals no evidence of scapular fracture.  Patient does have a fracture distal clavicle.  Patient is tender in this area with palpable abnormality.  Bruising extends from this area into the chest wall.  I suspect that ecchymosis is from the clavicular fracture.  However, given the ecchymosis, concern for muscle injury such as rotator cuff tear still exists.  At this time, patient is hematologically stable, reassuring labs, no evidence of arterial injury with good pulses distally.  At this time, no further imaging is deemed necessary to include angiography of the chest.  Patient be placed in sling, patient will be prescribed pain medications and advised to follow-up with orthopedics for further evaluation of clavicle fracture/shoulder injury.  Return precautions are discussed with the patient.  He verbalizes understanding of same..  Patient will follow-up with orthopedics.. Patient is given ED precautions to return to the ED for any worsening or new symptoms.     ____________________________________________  FINAL CLINICAL IMPRESSION(S) / ED DIAGNOSES  Final diagnoses:  Fall, initial encounter  Closed displaced fracture of acromial end of right clavicle, initial encounter  Traumatic ecchymosis of right shoulder, initial encounter      NEW MEDICATIONS STARTED DURING THIS VISIT:  ED Discharge Orders          Ordered    meloxicam (MOBIC) 15 MG tablet  Daily     02/21/19 1917    oxyCODONE-acetaminophen (PERCOCET/ROXICET) 5-325 MG tablet  Every 6 hours PRN     02/21/19 1917              This chart was dictated using voice recognition software/Dragon. Despite best efforts to proofread, errors can occur which can change the meaning. Any change was purely unintentional.    Racheal PatchesCuthriell, Jobie Popp D, PA-C 02/21/19 1918    Dionne BucySiadecki, Sebastian, MD 02/21/19 2037

## 2019-02-21 NOTE — ED Triage Notes (Signed)
Pt c/o pain to right shoulder after fall yesterday.  Pt tripped while playing with dog and fell under picnic table.  Large bruise to right anterior shoulder but impact was to posterior shoulder/scapula area.  No blood thinners. No LOC. No medical hx.  No SHOB.

## 2019-02-21 NOTE — ED Notes (Signed)
See triage note  Presents with pain to right posterior shoulder   States he fell backwards yesterday morning    Swelling and bruising noted to anterior shoulder

## 2019-08-16 ENCOUNTER — Other Ambulatory Visit: Payer: Self-pay

## 2019-08-16 ENCOUNTER — Encounter: Payer: Self-pay | Admitting: Emergency Medicine

## 2019-08-16 ENCOUNTER — Emergency Department
Admission: EM | Admit: 2019-08-16 | Discharge: 2019-08-16 | Disposition: A | Discharge status: Home / Self Care | Payer: Self-pay | Attending: Emergency Medicine | Admitting: Emergency Medicine

## 2019-08-16 ENCOUNTER — Emergency Department: Payer: Self-pay

## 2019-08-16 DIAGNOSIS — F17210 Nicotine dependence, cigarettes, uncomplicated: Secondary | ICD-10-CM | POA: Insufficient documentation

## 2019-08-16 DIAGNOSIS — Z79899 Other long term (current) drug therapy: Secondary | ICD-10-CM | POA: Insufficient documentation

## 2019-08-16 DIAGNOSIS — J441 Chronic obstructive pulmonary disease with (acute) exacerbation: Secondary | ICD-10-CM | POA: Insufficient documentation

## 2019-08-16 DIAGNOSIS — Z20822 Contact with and (suspected) exposure to covid-19: Secondary | ICD-10-CM | POA: Insufficient documentation

## 2019-08-16 DIAGNOSIS — J449 Chronic obstructive pulmonary disease, unspecified: Secondary | ICD-10-CM

## 2019-08-16 LAB — CBC WITH DIFFERENTIAL/PLATELET
Abs Immature Granulocytes: 0.01 10*3/uL (ref 0.00–0.07)
Basophils Absolute: 0.1 10*3/uL (ref 0.0–0.1)
Basophils Relative: 1 %
Eosinophils Absolute: 0.3 10*3/uL (ref 0.0–0.5)
Eosinophils Relative: 6 %
HCT: 42.6 % (ref 39.0–52.0)
Hemoglobin: 14.5 g/dL (ref 13.0–17.0)
Immature Granulocytes: 0 %
Lymphocytes Relative: 28 %
Lymphs Abs: 1.3 10*3/uL (ref 0.7–4.0)
MCH: 33 pg (ref 26.0–34.0)
MCHC: 34 g/dL (ref 30.0–36.0)
MCV: 96.8 fL (ref 80.0–100.0)
Monocytes Absolute: 0.6 10*3/uL (ref 0.1–1.0)
Monocytes Relative: 13 %
Neutro Abs: 2.4 10*3/uL (ref 1.7–7.7)
Neutrophils Relative %: 52 %
Platelets: 270 10*3/uL (ref 150–400)
RBC: 4.4 MIL/uL (ref 4.22–5.81)
RDW: 13.2 % (ref 11.5–15.5)
WBC: 4.7 10*3/uL (ref 4.0–10.5)
nRBC: 0 % (ref 0.0–0.2)

## 2019-08-16 LAB — BASIC METABOLIC PANEL
Anion gap: 10 (ref 5–15)
BUN: <5 mg/dL — ABNORMAL LOW (ref 6–20)
CO2: 26 mmol/L (ref 22–32)
Calcium: 9.5 mg/dL (ref 8.9–10.3)
Chloride: 101 mmol/L (ref 98–111)
Creatinine, Ser: 0.68 mg/dL (ref 0.61–1.24)
GFR calc Af Amer: >60 mL/min (ref >60)
GFR calc non Af Amer: >60 mL/min (ref >60)
Glucose, Bld: 84 mg/dL (ref 70–99)
Potassium: 4.1 mmol/L (ref 3.5–5.1)
Sodium: 137 mmol/L (ref 135–145)

## 2019-08-16 MED ORDER — ALBUTEROL SULFATE HFA 108 (90 BASE) MCG/ACT IN AERS: 2.0000 | INHALATION_SPRAY | Freq: Four times a day (QID) | RESPIRATORY_TRACT | 2 refills | Status: DC | PRN

## 2019-08-16 MED ORDER — METHYLPREDNISOLONE 4 MG PO TBPK: ORAL_TABLET | ORAL | 0 refills | Status: DC

## 2019-08-16 NOTE — ED Triage Notes (Signed)
Says he has had a cough for about a month.  Says has been feeling bad.  No fever.  Had covid test 3 weeks ago and it was negative.  No distress

## 2019-08-16 NOTE — Discharge Instructions (Signed)
Follow discharge care instructions take medication as directed.  Advised to decrease or stop cigarette smoking due to your current medical condition.

## 2019-08-16 NOTE — ED Provider Notes (Signed)
[facility] Emergency Department Provider Note   ____________________________________________   First MD Initiated Contact with Patient 08/16/19 1408     (approximate)  I have reviewed the triage vital signs and the nursing notes.   HISTORY  Chief Complaint Cough    HPI Larry Granberg. is a 59 y.o. male patient presents with intimating productive and nonproductive cough for about a month.  Patient had a negative COVID-19 test 3 weeks ago.  Patient unsure of fever denies chills.  No nausea, vomiting, diarrhea.  No recent travel or known contact with COVID-19.      Past Medical History:  Diagnosis Date  . Hypoglycemia     There are no problems to display for this patient.   Past Surgical History:  Procedure Laterality Date  . BACK SURGERY    . KNEE SURGERY Left    times 5   . TONSILLECTOMY      Prior to Admission medications   Medication Sig Start Date End Date Taking? Authorizing Provider  albuterol (PROVENTIL HFA;VENTOLIN HFA) 108 (90 Base) MCG/ACT inhaler Inhale 2 puffs into the lungs every 4 (four) hours as needed for wheezing or shortness of breath. 11/02/16   Lisa Roca, MD  albuterol (VENTOLIN HFA) 108 (90 Base) MCG/ACT inhaler Inhale 2 puffs into the lungs every 6 (six) hours as needed for wheezing or shortness of breath. 08/16/19   Sable Feil, PA-C  meloxicam (MOBIC) 15 MG tablet Take 1 tablet (15 mg total) by mouth daily. 02/21/19   Cuthriell, Charline Bills, PA-C  methylPREDNISolone (MEDROL DOSEPAK) 4 MG TBPK tablet Take Tapered dose as directed 08/16/19   Sable Feil, PA-C  mirtazapine (REMERON) 30 MG tablet Take 30 mg by mouth at bedtime.    [provider]  oxyCODONE-acetaminophen (PERCOCET/ROXICET) 5-325 MG tablet Take 1 tablet by mouth every 6 (six) hours as needed for severe pain. 02/21/19   Cuthriell, Charline Bills, PA-C    Allergies Mushroom extract complex and Penicillins  No family history on  file.  Social History Social History   Tobacco Use  . Smoking status: Current Every Day Smoker    Packs/day: 1.00    Types: Cigarettes  . Smokeless tobacco: Never Used  Substance Use Topics  . Alcohol use: Yes    Alcohol/week: 21.0 standard drinks    Types: 21 Cans of beer per week  . Drug use: No    Review of Systems Constitutional: No fever/chills Eyes: No visual changes. ENT: No sore throat. Cardiovascular: Denies chest pain. Respiratory: Denies shortness of breath. Gastrointestinal: No abdominal pain.  No nausea, no vomiting.  No diarrhea.  No constipation. Genitourinary: Negative for dysuria. Musculoskeletal: Negative for back pain. Skin: Negative for rash. Neurological: Negative for headaches, focal weakness or numbness. Allergic/Immunilogical: Mushroom extract and penicillin. ____________________________________________   PHYSICAL EXAM:  VITAL SIGNS: ED Triage Vitals  Enc Vitals Group     BP 08/16/19 1355 (!) 145/95     Pulse Rate 08/16/19 1355 97     Resp 08/16/19 1355 16     Temp 08/16/19 1355 98.5 F (36.9 C)     Temp Source 08/16/19 1355 Oral     SpO2 08/16/19 1355 98 %     Weight 08/16/19 1350 130 lb (59 kg)     Height 08/16/19 1350 5\' 10"  (1.778 m)     Head Circumference --      Peak Flow --      Pain Score 08/16/19 1350 5  Pain Loc --      Pain Edu? --      Excl. in [place]? --    Constitutional: Alert and oriented. Well appearing and in no acute distress. Nose: No congestion/rhinnorhea. Mouth/Throat: Mucous membranes are moist.  Oropharynx non-erythematous. Neck: No stridor.  Cardiovascular: Normal rate, regular rhythm. Grossly normal heart sounds.  Good peripheral circulation. Respiratory: Normal respiratory effort.  No retractions. Lungs CTAB. Gastrointestinal: Soft and nontender. No distention. No abdominal bruits. No CVA tenderness. Skin:  Skin is warm, dry and intact. No rash noted. Psychiatric: Mood and affect are normal. Speech and  behavior are normal.  ____________________________________________   LABS (all labs ordered are listed, but only abnormal results are displayed)  Labs Reviewed  BASIC METABOLIC PANEL - Abnormal; Notable for the following components:      Result Value   BUN <5 (*)    All other components within normal limits  SARS CORONAVIRUS 2 (TAT 6-24 HRS)  CBC WITH DIFFERENTIAL/PLATELET   ____________________________________________  EKG   ____________________________________________  RADIOLOGY  ED MD interpretation:    Official radiology report(s): DG Chest Portable 1 View  Result Date: 08/16/2019 CLINICAL DATA:  Cough for 1 month. EXAM: PORTABLE CHEST 1 VIEW COMPARISON:  Radiograph 02/21/2019 FINDINGS: The cardiomediastinal contours are normal. Hyperinflation appears similar to prior. Calcified granuloma at the left lung base. Pulmonary vasculature is normal. No consolidation, pleural effusion, or pneumothorax. No acute osseous abnormalities are seen. Remote right clavicle fracture with nonunion. IMPRESSION: Unchanged hyperinflation. No acute process. Electronically Signed   By: Narda Rutherford M.D.   On: 08/16/2019 14:19    ____________________________________________   PROCEDURES  Procedure(s) performed (including Critical Care):  Procedures   ____________________________________________   INITIAL IMPRESSION / ASSESSMENT AND PLAN / ED COURSE  As part of my medical decision making, I reviewed the following data within the electronic MEDICAL RECORD NUMBER      Patient presents with 1 month intermitting productive and nonproductive cough.  No fevers associated with complaint.  Discussed x-ray findings with patient.  Physical exam consistent with mild COPD.  Patient given discharge care instruction advised take medication directed.  Patient advised [facility]    Larry Schoffstall. was evaluated in Emergency Department on 08/16/2019 for the  symptoms described in the history of present illness. He was evaluated in the context of the global COVID-19 pandemic, which necessitated consideration that the patient might be at risk for infection with the SARS-CoV-2 virus that causes COVID-19. Institutional protocols and algorithms that pertain to the evaluation of patients at risk for COVID-19 are in a state of rapid change based on information released by regulatory bodies including the CDC and federal and state organizations. These policies and algorithms were followed during the patient's care in the ED.       ____________________________________________   FINAL CLINICAL IMPRESSION(S) / ED DIAGNOSES  Final diagnoses:  Chronic obstructive pulmonary disease, unspecified COPD type [facility])     ED Discharge Orders         Ordered    albuterol (VENTOLIN HFA) 108 (90 Base) MCG/ACT inhaler  Every 6 hours PRN     08/16/19 1508    methylPREDNISolone (MEDROL DOSEPAK) 4 MG TBPK tablet     08/16/19 1508           Note:  This document was prepared using Dragon voice recognition software and may include unintentional dictation errors.    Joni Reining, PA-C 08/16/19 1523  Minna Antis, MD 08/16/19 1536

## 2019-08-17 LAB — SARS CORONAVIRUS 2 (TAT 6-24 HRS): SARS Coronavirus 2: NEGATIVE

## 2020-01-17 ENCOUNTER — Telehealth: Payer: Self-pay | Admitting: General Practice

## 2020-01-17 NOTE — Telephone Encounter (Signed)
Individual has been contacted 3+ times regarding ED referral. Call could not be completed each time. No further attempts to contact individual will be made.

## 2021-12-11 ENCOUNTER — Other Ambulatory Visit: Payer: Self-pay | Admitting: Gerontology

## 2021-12-11 ENCOUNTER — Ambulatory Visit: Payer: Self-pay | Admitting: Gerontology

## 2021-12-11 ENCOUNTER — Encounter: Payer: Self-pay | Admitting: Gerontology

## 2021-12-11 ENCOUNTER — Other Ambulatory Visit: Payer: Self-pay

## 2021-12-11 VITALS — BP 99/71 | HR 94 | Temp 98.0°F | Resp 17 | Ht 69.5 in | Wt 125.0 lb

## 2021-12-11 DIAGNOSIS — Z8709 Personal history of other diseases of the respiratory system: Secondary | ICD-10-CM | POA: Insufficient documentation

## 2021-12-11 DIAGNOSIS — Z7689 Persons encountering health services in other specified circumstances: Secondary | ICD-10-CM

## 2021-12-11 DIAGNOSIS — F17200 Nicotine dependence, unspecified, uncomplicated: Secondary | ICD-10-CM

## 2021-12-11 DIAGNOSIS — R0602 Shortness of breath: Secondary | ICD-10-CM

## 2021-12-11 MED ORDER — FLUTICASONE-SALMETEROL 250-50 MCG/ACT IN AEPB
1.0000 | INHALATION_SPRAY | Freq: Two times a day (BID) | RESPIRATORY_TRACT | 3 refills | Status: DC
  Filled 2021-12-11: qty 60, 30d supply, fill #0

## 2021-12-11 MED ORDER — FLUTICASONE FUROATE-VILANTEROL 200-25 MCG/ACT IN AEPB
1.0000 | INHALATION_SPRAY | Freq: Every day | RESPIRATORY_TRACT | 3 refills | Status: DC
  Filled 2021-12-11: qty 60, 60d supply, fill #0

## 2021-12-11 MED ORDER — ALBUTEROL SULFATE HFA 108 (90 BASE) MCG/ACT IN AERS
2.0000 | INHALATION_SPRAY | Freq: Four times a day (QID) | RESPIRATORY_TRACT | 2 refills | Status: DC | PRN
  Filled 2021-12-11: qty 6.7, 25d supply, fill #0

## 2021-12-11 MED ORDER — ALBUTEROL SULFATE HFA 108 (90 BASE) MCG/ACT IN AERS: 2.0000 | INHALATION_SPRAY | Freq: Four times a day (QID) | RESPIRATORY_TRACT | 2 refills | Status: DC | PRN

## 2021-12-11 NOTE — Patient Instructions (Signed)
Smoking Tobacco Information, Adult Smoking tobacco can be harmful to your health. Tobacco contains a toxic colorless chemical called nicotine. Nicotine causes changes in your brain that make you want more and more. This is called addiction. This can make it hard to stop smoking once you start. Tobacco also has other toxic chemicals that can hurt your body and raise your risk of many cancers. Menthol or "lite" tobacco or cigarette brands are not safer than regular brands. How can smoking tobacco affect me? Smoking tobacco puts you at risk for: Cancer. Smoking is most commonly associated with lung cancer, but can also lead to cancer in other parts of the body. Chronic obstructive pulmonary disease (COPD). This is a long-term lung condition that makes it hard to breathe. It also gets worse over time. High blood pressure (hypertension), heart disease, stroke, heart attack, and lung infections, such as pneumonia. Cataracts. This is when the lenses in the eyes become clouded. Digestive problems. This may include peptic ulcers, heartburn, and gastroesophageal reflux disease (GERD). Oral health problems, such as gum disease, mouth sores, and tooth loss. Loss of taste and smell. Smoking also affects how you look and smell. Smoking may cause: Wrinkles. Yellow or stained teeth, fingers, and fingernails. Bad breath. Bad-smelling clothes and hair. Smoking tobacco can also affect your social life, because: It may be challenging to find places to smoke when away from home. Many workplaces, restaurants, hotels, and public places are tobacco-free. Smoking is expensive. This is due to the cost of tobacco and the long-term costs of treating health problems from smoking. Secondhand smoke may affect those around you. Secondhand smoke can cause lung cancer, breathing problems, and heart disease. Children of smokers have a higher risk for: Sudden infant death syndrome (SIDS). Ear infections. Lung infections. What  actions can I take to prevent health problems? Quit smoking  Do not start smoking. Quit if you already smoke. Do not replace cigarette smoking with vaping devices, such as e-cigarettes. Make a plan to quit smoking and commit to it. Look for programs to help you, and ask your health care provider for recommendations and ideas. Set a date and write down all the reasons you want to quit. Let your friends and family know you are quitting so they can help and support you. Consider finding friends who also want to quit. It can be easier to quit with someone else, so that you can support each other. Talk with your health care provider about using nicotine replacement medicines to help you quit. These include gum, lozenges, patches, sprays, or pills. If you try to quit but return to smoking, stay positive. It is common to slip up when you first quit, so take it one day at a time. Be prepared for cravings. When you feel the urge to smoke, chew gum or suck on hard candy. Lifestyle Stay busy. Take care of your body. Get plenty of exercise, eat a healthy diet, and drink plenty of water. Find ways to manage your stress, such as meditation, yoga, exercise, or time spent with friends and family. Ask your health care provider about having regular tests (screenings) to check for cancer. This may include blood tests, imaging tests, and other tests. Where to find support To get support to quit smoking, consider: Asking your health care provider for more information and resources. Joining a support group for people who want to quit smoking in your local community. There are many effective programs that may help you to quit. Calling the smokefree.gov counselor   helpline at 1-800-QUIT-NOW (1-800-784-8669). Where to find more information You may find more information about quitting smoking from: Centers for Disease Control and Prevention: cdc.gov/tobacco Smokefree.gov: smokefree.gov American Lung Association:  freedomfromsmoking.org Contact a health care provider if: You have problems breathing. Your lips, nose, or fingers turn blue. You have chest pain. You are coughing up blood. You feel like you will faint. You have other health changes that cause you to worry. Summary Smoking tobacco can negatively affect your health, the health of those around you, your finances, and your social life. Do not start smoking. Quit if you already smoke. If you need help quitting, ask your health care provider. Consider joining a support group for people in your local community who want to quit smoking. There are many effective programs that may help you to quit. This information is not intended to replace advice given to you by your health care provider. Make sure you discuss any questions you have with your health care provider. Document Revised: 06/18/2021 Document Reviewed: 06/18/2021 Elsevier Patient Education  2023 Elsevier Inc.  

## 2021-12-11 NOTE — Progress Notes (Signed)
New Patient Office Visit  Subjective    Patient ID: Larry Lynch., male    DOB: February 20, 1961  Age: 61 y.o. MRN: [id number]  CC:  Chief Complaint  Patient presents with   Establish Care    HPI Larry Lynch. Is a 61 y/o male who has history of hypoglycemia, presents to establish care. He resides at [facility] for alcohol rehabilitation. He states that he uses his Mom's Combivent inhaler and has been smoking for 30 years. He experiences intermittent shortness of breath with exertion He smokes less than a pack of cigarette daily and has being cutting down.  He denies chest pain, palpitation, and lightheadedness.  Overall, he states that he is doing well and offers no further complaint.    Outpatient Encounter Medications as of 12/11/2021  Medication Sig   B Complex Vitamins (B COMPLEX PO) Take 1 tablet by mouth daily.   guaiFENesin (MUCINEX PO) Take 1 tablet by mouth 2 (two) times daily.   Multiple Vitamin (MULTIVITAMIN) tablet Take 1 tablet by mouth daily.   [DISCONTINUED] fluticasone-salmeterol (ADVAIR) 250-50 MCG/ACT AEPB Inhale 1 puff into the lungs in the morning and at bedtime.   [DISCONTINUED] Ipratropium-Albuterol (COMBIVENT RESPIMAT) 20-100 MCG/ACT AERS respimat Inhale 1 puff into the lungs 3 (three) times daily.   albuterol (VENTOLIN HFA) 108 (90 Base) MCG/ACT inhaler Inhale 2 puffs into the lungs every 6 (six) hours as needed for wheezing or shortness of breath.   fluticasone-salmeterol (ADVAIR) 250-50 MCG/ACT AEPB Inhale 1 puff into the lungs in the morning and at bedtime.   [DISCONTINUED] albuterol (PROVENTIL HFA;VENTOLIN HFA) 108 (90 Base) MCG/ACT inhaler Inhale 2 puffs into the lungs every 4 (four) hours as needed for wheezing or shortness of breath.   [DISCONTINUED] albuterol (VENTOLIN HFA) 108 (90 Base) MCG/ACT inhaler Inhale 2 puffs into the lungs every 6 (six) hours as needed for wheezing or shortness of breath. (Patient not taking: Reported on  12/11/2021)   [DISCONTINUED] albuterol (VENTOLIN HFA) 108 (90 Base) MCG/ACT inhaler Inhale 2 puffs into the lungs every 6 (six) hours as needed for wheezing or shortness of breath.   [DISCONTINUED] meloxicam (MOBIC) 15 MG tablet Take 1 tablet (15 mg total) by mouth daily.   [DISCONTINUED] methylPREDNISolone (MEDROL DOSEPAK) 4 MG TBPK tablet Take Tapered dose as directed   [DISCONTINUED] mirtazapine (REMERON) 30 MG tablet Take 30 mg by mouth at bedtime. (Patient not taking: Reported on 12/11/2021)   [DISCONTINUED] oxyCODONE-acetaminophen (PERCOCET/ROXICET) 5-325 MG tablet Take 1 tablet by mouth every 6 (six) hours as needed for severe pain.   No facility-administered encounter medications on file as of 12/11/2021.    Past Medical History:  Diagnosis Date   Hypoglycemia     Past Surgical History:  Procedure Laterality Date   BACK SURGERY     KNEE SURGERY Left    times 5    TONSILLECTOMY      Family History  Problem Relation Age of Onset   Hyperlipidemia Mother    Hypertension Mother    CAD Mother    Hyperlipidemia Father    Diabetes Father    Dementia Father    Kidney disease Father    Alcohol abuse Sister    Drug abuse Sister    Kidney cancer Maternal Grandmother    Alcohol abuse Maternal Grandfather    Stroke Maternal Grandfather    Other Paternal 49        "old age"   Prostate cancer Paternal Grandfather  Social History   Socioeconomic History   Marital status: Widowed    Spouse name: Not on file   Number of children: Not on file   Years of education: Not on file   Highest education level: Not on file  Occupational History   Not on file  Tobacco Use   Smoking status: Every Day    Packs/day: 1.00    Years: 30.00    Pack years: 30.00    Types: Cigarettes   Smokeless tobacco: Never   Tobacco comments:    Living at [facility] since 09/19/21 for alcohol treatment  Vaping Use   Vaping Use: Never used  Substance and Sexual Activity   Alcohol  use: Not Currently    Comment: last use 09/17/21, previously 1 pint per day   Drug use: No   Sexual activity: Not on file  Other Topics Concern   Not on file  Social History Narrative   Not on file   Social Determinants of Health   Financial Resource Strain: Not on file  Food Insecurity: No Food Insecurity   Worried About Running Out of Food in the Last Year: Never true   Hungry Horse in the Last Year: Never true  Transportation Needs: No Transportation Needs   Lack of Transportation (Medical): No   Lack of Transportation (Non-Medical): No  Physical Activity: Not on file  Stress: Not on file  Social Connections: Not on file  Intimate Partner Violence: Not on file    Review of Systems  Constitutional: Negative.   HENT: Negative.    Eyes: Negative.   Respiratory:  Positive for shortness of breath.   Cardiovascular: Negative.   Gastrointestinal: Negative.   Genitourinary: Negative.   Musculoskeletal: Negative.   Skin: Negative.   Neurological: Negative.   Endo/Heme/Allergies: Negative.   Psychiatric/Behavioral: Negative.         Objective    BP 99/71 (BP Location: Right Arm, Patient Position: Sitting, Cuff Size: Normal)   Pulse 94   Temp 98 F (36.7 C) (Oral)   Resp 17   Ht 5' 9.5" (1.765 m)   Wt 125 lb (56.7 kg)   SpO2 94%   BMI 18.19 kg/m   Physical Exam HENT:     Head: Normocephalic and atraumatic.     Nose: Nose normal.     Mouth/Throat:     Mouth: Mucous membranes are moist.  Eyes:     Extraocular Movements: Extraocular movements intact.     Conjunctiva/sclera: Conjunctivae normal.     Pupils: Pupils are equal, round, and reactive to light.  Cardiovascular:     Rate and Rhythm: Normal rate and regular rhythm.     Pulses: Normal pulses.     Heart sounds: Normal heart sounds.  Pulmonary:     Effort: Pulmonary effort is normal.     Breath sounds: Normal breath sounds.  Abdominal:     General: Abdomen is flat. Bowel sounds are normal.      Palpations: Abdomen is soft.  Genitourinary:    Comments: Deferred per patient Musculoskeletal:        General: Normal range of motion.     Cervical back: Normal range of motion.  Skin:    General: Skin is warm.  Neurological:     General: No focal deficit present.     Mental Status: He is alert and oriented to person, place, and time. Mental status is at baseline.  Psychiatric:        Mood and  Affect: Mood normal.        Behavior: Behavior normal.        Thought Content: Thought content normal.        Judgment: Judgment normal.      Assessment & Plan:   1. Encounter to establish care -Routine labs will be checked. - CBC w/Diff; Future - Comp Met (CMET); Future - Lipid panel; Future - HgB A1c; Future - B12 and Folate Panel; Future - B12 and Folate Panel - HgB A1c - Lipid panel - Comp Met (CMET) - CBC w/Diff  2. Shortness of breath -He was started on albuterol for intermittent shortness of breath.  He was educated on medication side effects and advised to notify clinic. - albuterol (VENTOLIN HFA) 108 (90 Base) MCG/ACT inhaler; Inhale 2 puffs into the lungs every 6 (six) hours as needed for wheezing or shortness of breath.  Dispense: 18 g; Refill: 2  3. History of COPD -He was started on Advair, educated on medication side effects and advised to notify clinic. - fluticasone-salmeterol (ADVAIR) 250-50 MCG/ACT AEPB; Inhale 1 puff into the lungs in the morning and at bedtime.  Dispense: 60 each; Refill: 3  4. Smoking -He was encouraged on smoking cessation, provided with [place] quit line information and advised to call.  He will follow-up with low-dose CT scan for lung cancer screening - CT CHEST LUNG CA SCREEN LOW DOSE W/O CM; Future   Return in about 29 days (around 01/09/2022), or if symptoms worsen or fail to improve.   Herma Uballe Jerold Coombe, NP

## 2021-12-12 LAB — COMPREHENSIVE METABOLIC PANEL
ALT: 18 IU/L (ref 0–44)
AST: 21 IU/L (ref 0–40)
Albumin/Globulin Ratio: 2 (ref 1.2–2.2)
Albumin: 4.6 g/dL (ref 3.8–4.9)
Alkaline Phosphatase: 70 IU/L (ref 44–121)
BUN/Creatinine Ratio: 14 (ref 9–20)
BUN: 13 mg/dL (ref 6–24)
Bilirubin Total: 0.3 mg/dL (ref 0.0–1.2)
CO2: 24 mmol/L (ref 20–29)
Calcium: 10 mg/dL (ref 8.7–10.2)
Chloride: 100 mmol/L (ref 96–106)
Creatinine, Ser: 0.92 mg/dL (ref 0.76–1.27)
Globulin, Total: 2.3 g/dL (ref 1.5–4.5)
Glucose: 92 mg/dL (ref 70–99)
Potassium: 4.5 mmol/L (ref 3.5–5.2)
Sodium: 138 mmol/L (ref 134–144)
Total Protein: 6.9 g/dL (ref 6.0–8.5)
eGFR: 96 mL/min/{1.73_m2} (ref >59)

## 2021-12-12 LAB — CBC WITH DIFFERENTIAL/PLATELET
Basophils Absolute: 0.1 10*3/uL (ref 0.0–0.2)
Basos: 1 %
EOS (ABSOLUTE): 0.5 10*3/uL — ABNORMAL HIGH (ref 0.0–0.4)
Eos: 11 %
Hematocrit: 40.1 % (ref 37.5–51.0)
Hemoglobin: 13.8 g/dL (ref 13.0–17.7)
Immature Grans (Abs): 0 10*3/uL (ref 0.0–0.1)
Immature Granulocytes: 0 %
Lymphocytes Absolute: 1.7 10*3/uL (ref 0.7–3.1)
Lymphs: 34 %
MCH: 31 pg (ref 26.6–33.0)
MCHC: 34.4 g/dL (ref 31.5–35.7)
MCV: 90 fL (ref 79–97)
Monocytes Absolute: 0.6 10*3/uL (ref 0.1–0.9)
Monocytes: 13 %
Neutrophils Absolute: 2 10*3/uL (ref 1.4–7.0)
Neutrophils: 41 %
Platelets: 290 10*3/uL (ref 150–450)
RBC: 4.45 x10E6/uL (ref 4.14–5.80)
RDW: 11.8 % (ref 11.6–15.4)
WBC: 4.8 10*3/uL (ref 3.4–10.8)

## 2021-12-12 LAB — HEMOGLOBIN A1C
Est. average glucose Bld gHb Est-mCnc: 120 mg/dL
Hgb A1c MFr Bld: 5.8 % — ABNORMAL HIGH (ref 4.8–5.6)

## 2021-12-12 LAB — B12 AND FOLATE PANEL
Folate: >20 ng/mL (ref >3.0)
Vitamin B-12: 338 pg/mL (ref 232–1245)

## 2021-12-12 LAB — LIPID PANEL
Chol/HDL Ratio: 3.9 ratio (ref 0.0–5.0)
Cholesterol, Total: 197 mg/dL (ref 100–199)
HDL: 51 mg/dL (ref >39)
LDL Chol Calc (NIH): 127 mg/dL — ABNORMAL HIGH (ref 0–99)
Triglycerides: 105 mg/dL (ref 0–149)
VLDL Cholesterol Cal: 19 mg/dL (ref 5–40)

## 2021-12-19 ENCOUNTER — Ambulatory Visit
Admission: RE | Admit: 2021-12-19 | Discharge: 2021-12-19 | Disposition: A | Discharge status: Home / Self Care | Payer: Medicaid Other | Source: Ambulatory Visit | Attending: Gerontology | Admitting: Gerontology

## 2021-12-19 DIAGNOSIS — F17200 Nicotine dependence, unspecified, uncomplicated: Secondary | ICD-10-CM

## 2021-12-25 ENCOUNTER — Other Ambulatory Visit: Payer: Self-pay | Admitting: Pharmacy Technician

## 2021-12-25 NOTE — Progress Notes (Signed)
Patient currently uninsured.  Received 2023 poi.  Eligible for medication assistance at [facility] as long as eligibility continues to be met.  Larry Lynch J. Johnathan Tortorelli Patient Advocate Specialist [facility]  

## 2021-12-26 ENCOUNTER — Other Ambulatory Visit: Payer: Self-pay

## 2021-12-26 MED ORDER — FLUTICASONE FUROATE-VILANTEROL 200-25 MCG/ACT IN AEPB: INHALATION_SPRAY | RESPIRATORY_TRACT | 3 refills | Status: DC

## 2022-01-09 ENCOUNTER — Other Ambulatory Visit: Payer: Self-pay

## 2022-01-09 ENCOUNTER — Ambulatory Visit: Payer: Medicaid Other | Admitting: Gerontology

## 2022-01-09 ENCOUNTER — Encounter: Payer: Self-pay | Admitting: Gerontology

## 2022-01-09 VITALS — BP 108/72 | HR 75 | Temp 98.1°F | Resp 16 | Ht 69.5 in | Wt 124.3 lb

## 2022-01-09 DIAGNOSIS — R0602 Shortness of breath: Secondary | ICD-10-CM

## 2022-01-09 DIAGNOSIS — E785 Hyperlipidemia, unspecified: Secondary | ICD-10-CM | POA: Insufficient documentation

## 2022-01-09 DIAGNOSIS — R7303 Prediabetes: Secondary | ICD-10-CM

## 2022-01-09 DIAGNOSIS — Z8709 Personal history of other diseases of the respiratory system: Secondary | ICD-10-CM

## 2022-01-09 DIAGNOSIS — F17200 Nicotine dependence, unspecified, uncomplicated: Secondary | ICD-10-CM

## 2022-01-09 MED ORDER — FLUTICASONE-SALMETEROL 250-50 MCG/ACT IN AEPB
1.0000 | INHALATION_SPRAY | Freq: Two times a day (BID) | RESPIRATORY_TRACT | 1 refills | Status: DC
  Filled 2022-01-09: qty 60, 30d supply, fill #0

## 2022-01-09 MED ORDER — ALBUTEROL SULFATE HFA 108 (90 BASE) MCG/ACT IN AERS
2.0000 | INHALATION_SPRAY | Freq: Four times a day (QID) | RESPIRATORY_TRACT | 4 refills | Status: AC | PRN
  Filled 2022-01-09: qty 6.7, 25d supply, fill #0
  Filled 2022-04-02: qty 6.7, 25d supply, fill #1
  Filled 2022-07-31 (×2): qty 6.7, 25d supply, fill #2

## 2022-01-09 MED ORDER — FLUTICASONE FUROATE-VILANTEROL 200-25 MCG/ACT IN AEPB
1.0000 | INHALATION_SPRAY | Freq: Every day | RESPIRATORY_TRACT | 3 refills | Status: DC
  Filled 2022-01-09: qty 60, 60d supply, fill #0
  Filled 2022-01-29: qty 180, 90d supply, fill #0

## 2022-01-09 NOTE — Progress Notes (Signed)
Established Patient Office Visit  Subjective   Patient ID: Larry Lynch., male    DOB: [birth year]  Age: 61 y.o. MRN: [id number]  Chief Complaint  Patient presents with  . Follow-up    Labs drawn 12/11/21    HPI  Larry Lynch. Is a 61 y/o male who has history of hypoglycemia who presents for follow up visit and lab review. His LDL was 127 mg/dl and HgbA1c was 5.8%. Had Low dose CT scan that was done on 12/19/21 and it showed Lung-RADS 1, negative. Continue annual screening with low-dose, chest CT without contrast in 12 months. 2. Coronary artery calcifications 3. Aortic Atherosclerosis (ICD10-I70.0) and Emphysema (ICD10-J43.9). He continues to smoke 1 pack of cigarette daily and admits the desire to quit. Overall, he states that he's doing well and offers no further complaint.    Review of Systems  Constitutional: Negative.   Respiratory: Negative.    Cardiovascular: Negative.   Neurological: Negative.   Endo/Heme/Allergies: Negative.   Psychiatric/Behavioral: Negative.        Objective:     BP 108/72 (BP Location: Right Arm, Patient Position: Sitting, Cuff Size: Large)   Pulse 75   Temp 98.1 F (36.7 C) (Oral)   Resp 16   Ht 5' 9.5" (1.765 m)   Wt 124 lb 4.8 oz (56.4 kg)   SpO2 94%   BMI 18.09 kg/m  BP Readings from Last 3 Encounters:  01/09/22 108/72  12/11/21 99/71  08/16/19 127/90   Wt Readings from Last 3 Encounters:  01/09/22 124 lb 4.8 oz (56.4 kg)  12/11/21 125 lb (56.7 kg)  08/16/19 130 lb (59 kg)      Physical Exam HENT:     Head: Normocephalic and atraumatic.     Mouth/Throat:     Mouth: Mucous membranes are moist.  Eyes:     Extraocular Movements: Extraocular movements intact.     Conjunctiva/sclera: Conjunctivae normal.     Pupils: Pupils are equal, round, and reactive to light.  Cardiovascular:     Rate and Rhythm: Normal rate and regular rhythm.     Pulses: Normal pulses.     Heart sounds: Normal heart sounds.   Pulmonary:     Effort: Pulmonary effort is normal.     Breath sounds: Normal breath sounds.  Skin:    General: Skin is warm.  Neurological:     General: No focal deficit present.     Mental Status: He is alert and oriented to person, place, and time. Mental status is at baseline.  Psychiatric:        Mood and Affect: Mood normal.        Behavior: Behavior normal.        Thought Content: Thought content normal.        Judgment: Judgment normal.     No results found for any visits on 01/09/22.  Last CBC Lab Results  Component Value Date   WBC 4.8 12/11/2021   HGB 13.8 12/11/2021   HCT 40.1 12/11/2021   MCV 90 12/11/2021   MCH 31.0 12/11/2021   RDW 11.8 12/11/2021   PLT 290 94/85/4627   Last metabolic panel Lab Results  Component Value Date   GLUCOSE 92 12/11/2021   NA 138 12/11/2021   K 4.5 12/11/2021   CL 100 12/11/2021   CO2 24 12/11/2021   BUN 13 12/11/2021   CREATININE 0.92 12/11/2021   EGFR 96 12/11/2021   CALCIUM 10.0 12/11/2021   PROT 6.9  12/11/2021   ALBUMIN 4.6 12/11/2021   LABGLOB 2.3 12/11/2021   AGRATIO 2.0 12/11/2021   BILITOT 0.3 12/11/2021   ALKPHOS 70 12/11/2021   AST 21 12/11/2021   ALT 18 12/11/2021   ANIONGAP 10 08/16/2019   Last lipids Lab Results  Component Value Date   CHOL 197 12/11/2021   HDL 51 12/11/2021   LDLCALC 127 (H) 12/11/2021   TRIG 105 12/11/2021   CHOLHDL 3.9 12/11/2021   Last hemoglobin A1c Lab Results  Component Value Date   HGBA1C 5.8 (H) 12/11/2021      The 10-year ASCVD risk score (Arnett DK, et al., 2019) is: 9.5%    Assessment & Plan:   1. Smoking -He was encouraged on smoking cessation and he states he is going to think about it.  2. Pre-diabetes -His hemoglobin A1c was 5.8%, he declines metformin therapy and states that he is going to work on his diet.  He was encouraged to continue on low carbohydrate/non concentrated sweet diet. - HgB A1c; Future  3. Elevated lipids -His ASCVD risk factor was  9.5%, he declines statin therapy he states that he is going to work on his diet.  He was advised to continue on low-fat/cholesterol diet - Lipid panel; Future  4. History of COPD -He will continue on Advair, advised on smoking cessation. - fluticasone furoate-vilanterol (BREO ELLIPTA) 200-25 MCG/ACT AEPB; Inhale 1 puff into the lungs daily.  Dispense: 60 each; Refill: 3 - fluticasone-salmeterol (ADVAIR) 250-50 MCG/ACT AEPB; Inhale 1 puff into the lungs in the morning and at bedtime.  Dispense: 60 each; Refill: 1  5. Shortness of breath -He will continue to use albuterol as needed and was encouraged on smoking cessation. - albuterol (VENTOLIN HFA) 108 (90 Base) MCG/ACT inhaler; Inhale 2 puffs into the lungs every 6 (six) hours as needed for wheezing or shortness of breath.  Dispense: 6.7 g; Refill: 4   Return in about 13 weeks (around 04/10/2022), or if symptoms worsen or fail to improve.    Myesha Stillion Jerold Coombe, NP

## 2022-01-09 NOTE — Patient Instructions (Signed)
Smoking Tobacco Information, Adult Smoking tobacco can be harmful to your health. Tobacco contains a toxic colorless chemical called nicotine. Nicotine causes changes in your brain that make you want more and more. This is called addiction. This can make it hard to stop smoking once you start. Tobacco also has other toxic chemicals that can hurt your body and raise your risk of many cancers. Menthol or "lite" tobacco or cigarette brands are not safer than regular brands. How can smoking tobacco affect me? Smoking tobacco puts you at risk for: Cancer. Smoking is most commonly associated with lung cancer, but can also lead to cancer in other parts of the body. Chronic obstructive pulmonary disease (COPD). This is a long-term lung condition that makes it hard to breathe. It also gets worse over time. High blood pressure (hypertension), heart disease, stroke, heart attack, and lung infections, such as pneumonia. Cataracts. This is when the lenses in the eyes become clouded. Digestive problems. This may include peptic ulcers, heartburn, and gastroesophageal reflux disease (GERD). Oral health problems, such as gum disease, mouth sores, and tooth loss. Loss of taste and smell. Smoking also affects how you look and smell. Smoking may cause: Wrinkles. Yellow or stained teeth, fingers, and fingernails. Bad breath. Bad-smelling clothes and hair. Smoking tobacco can also affect your social life, because: It may be challenging to find places to smoke when away from home. Many workplaces, restaurants, hotels, and public places are tobacco-free. Smoking is expensive. This is due to the cost of tobacco and the long-term costs of treating health problems from smoking. Secondhand smoke may affect those around you. Secondhand smoke can cause lung cancer, breathing problems, and heart disease. Children of smokers have a higher risk for: Sudden infant death syndrome (SIDS). Ear infections. Lung infections. What  actions can I take to prevent health problems? Quit smoking  Do not start smoking. Quit if you already smoke. Do not replace cigarette smoking with vaping devices, such as e-cigarettes. Make a plan to quit smoking and commit to it. Look for programs to help you, and ask your health care provider for recommendations and ideas. Set a date and write down all the reasons you want to quit. Let your friends and family know you are quitting so they can help and support you. Consider finding friends who also want to quit. It can be easier to quit with someone else, so that you can support each other. Talk with your health care provider about using nicotine replacement medicines to help you quit. These include gum, lozenges, patches, sprays, or pills. If you try to quit but return to smoking, stay positive. It is common to slip up when you first quit, so take it one day at a time. Be prepared for cravings. When you feel the urge to smoke, chew gum or suck on hard candy. Lifestyle Stay busy. Take care of your body. Get plenty of exercise, eat a healthy diet, and drink plenty of water. Find ways to manage your stress, such as meditation, yoga, exercise, or time spent with friends and family. Ask your health care provider about having regular tests (screenings) to check for cancer. This may include blood tests, imaging tests, and other tests. Where to find support To get support to quit smoking, consider: Asking your health care provider for more information and resources. Joining a support group for people who want to quit smoking in your local community. There are many effective programs that may help you to quit. Calling the smokefree.gov counselor   helpline at 1-800-QUIT-NOW (1-800-784-8669). Where to find more information You may find more information about quitting smoking from: Centers for Disease Control and Prevention: cdc.gov/tobacco Smokefree.gov: smokefree.gov American Lung Association:  freedomfromsmoking.org Contact a health care provider if: You have problems breathing. Your lips, nose, or fingers turn blue. You have chest pain. You are coughing up blood. You feel like you will faint. You have other health changes that cause you to worry. Summary Smoking tobacco can negatively affect your health, the health of those around you, your finances, and your social life. Do not start smoking. Quit if you already smoke. If you need help quitting, ask your health care provider. Consider joining a support group for people in your local community who want to quit smoking. There are many effective programs that may help you to quit. This information is not intended to replace advice given to you by your health care provider. Make sure you discuss any questions you have with your health care provider. Document Revised: 06/18/2021 Document Reviewed: 06/18/2021 Elsevier Patient Education  2023 Elsevier Inc.  

## 2022-01-10 ENCOUNTER — Other Ambulatory Visit: Payer: Self-pay

## 2022-01-13 ENCOUNTER — Other Ambulatory Visit: Payer: Self-pay

## 2022-01-17 ENCOUNTER — Other Ambulatory Visit: Payer: Self-pay

## 2022-01-20 ENCOUNTER — Other Ambulatory Visit: Payer: Self-pay

## 2022-01-29 ENCOUNTER — Other Ambulatory Visit: Payer: Self-pay

## 2022-03-06 ENCOUNTER — Other Ambulatory Visit (HOSPITAL_COMMUNITY): Payer: Self-pay

## 2022-03-26 ENCOUNTER — Other Ambulatory Visit: Payer: Self-pay

## 2022-04-02 ENCOUNTER — Other Ambulatory Visit: Payer: Self-pay

## 2022-04-02 ENCOUNTER — Other Ambulatory Visit: Payer: Medicaid Other

## 2022-04-02 VITALS — BP 119/79 | HR 78 | Temp 98.3°F | Ht 69.0 in | Wt 127.2 lb

## 2022-04-02 DIAGNOSIS — Z8709 Personal history of other diseases of the respiratory system: Secondary | ICD-10-CM

## 2022-04-02 DIAGNOSIS — R7303 Prediabetes: Secondary | ICD-10-CM

## 2022-04-02 DIAGNOSIS — E785 Hyperlipidemia, unspecified: Secondary | ICD-10-CM

## 2022-04-02 MED ORDER — FLUTICASONE-SALMETEROL 250-50 MCG/ACT IN AEPB
1.0000 | INHALATION_SPRAY | Freq: Two times a day (BID) | RESPIRATORY_TRACT | 3 refills | Status: DC
  Filled 2022-04-02: qty 60, 30d supply, fill #0
  Filled 2022-05-01: qty 180, 90d supply, fill #0

## 2022-04-03 LAB — LIPID PANEL
Chol/HDL Ratio: 4.3 ratio (ref 0.0–5.0)
Cholesterol, Total: 238 mg/dL — ABNORMAL HIGH (ref 100–199)
HDL: 56 mg/dL (ref >39)
LDL Chol Calc (NIH): 157 mg/dL — ABNORMAL HIGH (ref 0–99)
Triglycerides: 138 mg/dL (ref 0–149)
VLDL Cholesterol Cal: 25 mg/dL (ref 5–40)

## 2022-04-03 LAB — HEMOGLOBIN A1C
Est. average glucose Bld gHb Est-mCnc: 126 mg/dL
Hgb A1c MFr Bld: 6 % — ABNORMAL HIGH (ref 4.8–5.6)

## 2022-04-10 ENCOUNTER — Encounter: Payer: Self-pay | Admitting: Gerontology

## 2022-04-10 ENCOUNTER — Ambulatory Visit: Payer: Medicaid Other | Admitting: Gerontology

## 2022-04-10 ENCOUNTER — Other Ambulatory Visit: Payer: Self-pay

## 2022-04-10 VITALS — BP 125/79 | HR 71 | Temp 97.8°F | Wt 127.0 lb

## 2022-04-10 DIAGNOSIS — R7303 Prediabetes: Secondary | ICD-10-CM

## 2022-04-10 DIAGNOSIS — F17200 Nicotine dependence, unspecified, uncomplicated: Secondary | ICD-10-CM

## 2022-04-10 DIAGNOSIS — E785 Hyperlipidemia, unspecified: Secondary | ICD-10-CM

## 2022-04-10 DIAGNOSIS — Z8709 Personal history of other diseases of the respiratory system: Secondary | ICD-10-CM

## 2022-04-10 MED ORDER — FLUTICASONE FUROATE-VILANTEROL 200-25 MCG/ACT IN AEPB
1.0000 | INHALATION_SPRAY | Freq: Every day | RESPIRATORY_TRACT | 2 refills | Status: DC
  Filled 2022-04-10: qty 60, 30d supply, fill #0
  Filled 2022-04-10: qty 60, 60d supply, fill #0

## 2022-04-10 MED ORDER — FLUTICASONE FUROATE-VILANTEROL 200-25 MCG/ACT IN AEPB
1.0000 | INHALATION_SPRAY | Freq: Every day | RESPIRATORY_TRACT | 2 refills | Status: DC
  Filled 2022-04-10: qty 60, 60d supply, fill #0

## 2022-04-10 MED ORDER — ATORVASTATIN CALCIUM 10 MG PO TABS
10.0000 mg | ORAL_TABLET | Freq: Every day | ORAL | 0 refills | Status: DC
  Filled 2022-04-10: qty 30, 30d supply, fill #0

## 2022-04-10 MED ORDER — METFORMIN HCL 500 MG PO TABS
500.0000 mg | ORAL_TABLET | Freq: Every day | ORAL | 0 refills | Status: DC
  Filled 2022-04-10: qty 30, 30d supply, fill #0

## 2022-04-10 NOTE — Patient Instructions (Signed)
Heart-Healthy Eating Plan Many factors influence your heart health, including eating and exercise habits. Heart health is also called coronary health. Coronary risk increases with abnormal blood fat (lipid) levels. A heart-healthy eating plan includes limiting unhealthy fats, increasing healthy fats, limiting salt (sodium) intake, and making other diet and lifestyle changes. What is my plan? Your health care provider may recommend that: You limit your fat intake to _________% or less of your total calories each day. You limit your saturated fat intake to _________% or less of your total calories each day. You limit the amount of cholesterol in your diet to less than _________ mg per day. You limit the amount of sodium in your diet to less than _________ mg per day. What are tips for following this plan? Cooking Cook foods using methods other than frying. Baking, boiling, grilling, and broiling are all good options. Other ways to reduce fat include: Removing the skin from poultry. Removing all visible fats from meats. Steaming vegetables in water or broth. Meal planning  At meals, imagine dividing your plate into fourths: Fill one-half of your plate with vegetables and green salads. Fill one-fourth of your plate with whole grains. Fill one-fourth of your plate with lean protein foods. Eat 2-4 cups of vegetables per day. One cup of vegetables equals 1 cup (91 g) broccoli or cauliflower florets, 2 medium carrots, 1 large bell pepper, 1 large sweet potato, 1 large tomato, 1 medium white potato, 2 cups (150 g) raw leafy greens. Eat 1-2 cups of fruit per day. One cup of fruit equals 1 small apple, 1 large banana, 1 cup (237 g) mixed fruit, 1 large orange,  cup (82 g) dried fruit, 1 cup (240 mL) 100% fruit juice. Eat more foods that contain soluble fiber. Examples include apples, broccoli, carrots, beans, peas, and barley. Aim to get 25-30 g of fiber per day. Increase your consumption of legumes,  nuts, and seeds to 4-5 servings per week. One serving of dried beans or legumes equals  cup (90 g) cooked, 1 serving of nuts is  oz (12 almonds, 24 pistachios, or 7 walnut halves), and 1 serving of seeds equals  oz (8 g). Fats Choose healthy fats more often. Choose monounsaturated and polyunsaturated fats, such as olive and canola oils, avocado oil, flaxseeds, walnuts, almonds, and seeds. Eat more omega-3 fats. Choose salmon, mackerel, sardines, tuna, flaxseed oil, and ground flaxseeds. Aim to eat fish at least 2 times each week. Check food labels carefully to identify foods with trans fats or high amounts of saturated fat. Limit saturated fats. These are found in animal products, such as meats, butter, and cream. Plant sources of saturated fats include palm oil, palm kernel oil, and coconut oil. Avoid foods with partially hydrogenated oils in them. These contain trans fats. Examples are stick margarine, some tub margarines, cookies, crackers, and other baked goods. Avoid fried foods. General information Eat more home-cooked food and less restaurant, buffet, and fast food. Limit or avoid alcohol. Limit foods that are high in added sugar and simple starches such as foods made using white refined flour (white breads, pastries, sweets). Lose weight if you are overweight. Losing just 5-10% of your body weight can help your overall health and prevent diseases such as diabetes and heart disease. Monitor your sodium intake, especially if you have high blood pressure. Talk with your health care provider about your sodium intake. Try to incorporate more vegetarian meals weekly. What foods should I eat? Fruits All fresh, canned (in natural   juice), or frozen fruits. Vegetables Fresh or frozen vegetables (raw, steamed, roasted, or grilled). Green salads. Grains Most grains. Choose whole wheat and whole grains most of the time. Rice and pasta, including brown rice and pastas made with whole wheat. Meats  and other proteins Lean, well-trimmed beef, veal, pork, and lamb. Chicken and turkey without skin. All fish and shellfish. Wild duck, rabbit, pheasant, and venison. Egg whites or low-cholesterol egg substitutes. Dried beans, peas, lentils, and tofu. Seeds and most nuts. Dairy Low-fat or nonfat cheeses, including ricotta and mozzarella. Skim or 1% milk (liquid, powdered, or evaporated). Buttermilk made with low-fat milk. Nonfat or low-fat yogurt. Fats and oils Non-hydrogenated (trans-free) margarines. Vegetable oils, including soybean, sesame, sunflower, olive, avocado, peanut, safflower, corn, canola, and cottonseed. Salad dressings or mayonnaise made with a vegetable oil. Beverages Water (mineral or sparkling). Coffee and tea. Unsweetened ice tea. Diet beverages. Sweets and desserts Sherbet, gelatin, and fruit ice. Small amounts of dark chocolate. Limit all sweets and desserts. Seasonings and condiments All seasonings and condiments. The items listed above may not be a complete list of foods and beverages you can eat. Contact a dietitian for more options. What foods should I avoid? Fruits Canned fruit in heavy syrup. Fruit in cream or butter sauce. Fried fruit. Limit coconut. Vegetables Vegetables cooked in cheese, cream, or butter sauce. Fried vegetables. Grains Breads made with saturated or trans fats, oils, or whole milk. Croissants. Sweet rolls. Donuts. High-fat crackers, such as cheese crackers and chips. Meats and other proteins Fatty meats, such as hot dogs, ribs, sausage, bacon, rib-eye roast or steak. High-fat deli meats, such as salami and bologna. Caviar. Domestic duck and goose. Organ meats, such as liver. Dairy Cream, sour cream, cream cheese, and creamed cottage cheese. Whole-milk cheeses. Whole or 2% milk (liquid, evaporated, or condensed). Whole buttermilk. Cream sauce or high-fat cheese sauce. Whole-milk yogurt. Fats and oils Meat fat, or shortening. Cocoa butter,  hydrogenated oils, palm oil, coconut oil, palm kernel oil. Solid fats and shortenings, including bacon fat, salt pork, lard, and butter. Nondairy cream substitutes. Salad dressings with cheese or sour cream. Beverages Regular sodas and any drinks with added sugar. Sweets and desserts Frosting. Pudding. Cookies. Cakes. Pies. Milk chocolate or white chocolate. Buttered syrups. Full-fat ice cream or ice cream drinks. The items listed above may not be a complete list of foods and beverages to avoid. Contact a dietitian for more information. Summary Heart-healthy meal planning includes limiting unhealthy fats, increasing healthy fats, limiting salt (sodium) intake and making other diet and lifestyle changes. Lose weight if you are overweight. Losing just 5-10% of your body weight can help your overall health and prevent diseases such as diabetes and heart disease. Focus on eating a balance of foods, including fruits and vegetables, low-fat or nonfat dairy, lean protein, nuts and legumes, whole grains, and heart-healthy oils and fats. This information is not intended to replace advice given to you by your health care provider. Make sure you discuss any questions you have with your health care provider. Document Revised: 07/29/2021 Document Reviewed: 07/29/2021 Elsevier Patient Education  2023 [employer] Inc. Carbohydrate Counting for Diabetes Mellitus, Adult Carbohydrate counting is a method of keeping track of how many carbohydrates you eat. Eating carbohydrates increases the amount of sugar (glucose) in the blood. Counting how many carbohydrates you eat improves how well you manage your blood glucose. This, in turn, helps you manage your diabetes. Carbohydrates are measured in grams (g) per serving. It is important to know how   many carbohydrates (in grams or by serving size) you can have in each meal. This is different for every person. A dietitian can help you make a meal plan and calculate how many  carbohydrates you should have at each meal and snack. What foods contain carbohydrates? Carbohydrates are found in the following foods: Grains, such as breads and cereals. Dried beans and soy products. Starchy vegetables, such as potatoes, peas, and corn. Fruit and fruit juices. Milk and yogurt. Sweets and snack foods, such as cake, cookies, candy, chips, and soft drinks. How do I count carbohydrates in foods? There are two ways to count carbohydrates in food. You can read food labels or learn standard serving sizes of foods. You can use either of these methods or a combination of both. Using the Nutrition Facts label The Nutrition Facts list is included on the labels of almost all packaged foods and beverages in the [place]. It includes: The serving size. Information about nutrients in each serving, including the grams of carbohydrate per serving. To use the Nutrition Facts, decide how many servings you will have. Then, multiply the number of servings by the number of carbohydrates per serving. The resulting number is the total grams of carbohydrates that you will be having. Learning the standard serving sizes of foods When you eat carbohydrate foods that are not packaged or do not include Nutrition Facts on the label, you need to measure the servings in order to count the grams of carbohydrates. Measure the foods that you will eat with a food scale or measuring cup, if needed. Decide how many standard-size servings you will eat. Multiply the number of servings by 15. For foods that contain carbohydrates, one serving equals 15 g of carbohydrates. For example, if you eat 2 cups or 10 oz (300 g) of strawberries, you will have eaten 2 servings and 30 g of carbohydrates (2 servings x 15 g = 30 g). For foods that have more than one food mixed, such as soups and casseroles, you must count the carbohydrates in each food that is included. The following list contains standard serving sizes of  common carbohydrate-rich foods. Each of these servings has about 15 g of carbohydrates: 1 slice of bread. 1 six-inch (15 cm) tortilla. ? cup or 2 oz (53 g) cooked rice or pasta.  cup or 3 oz (85 g) cooked or canned, drained and rinsed beans or lentils.  cup or 3 oz (85 g) starchy vegetable, such as peas, corn, or squash.  cup or 4 oz (120 g) hot cereal.  cup or 3 oz (85 g) boiled or mashed potatoes, or  or 3 oz (85 g) of a large baked potato.  cup or 4 fl oz (118 mL) fruit juice. 1 cup or 8 fl oz (237 mL) milk. 1 small or 4 oz (106 g) apple.  or 2 oz (63 g) of a medium banana. 1 cup or 5 oz (150 g) strawberries. 3 cups or 1 oz (28.3 g) popped popcorn. What is an example of carbohydrate counting? To calculate the grams of carbohydrates in this sample meal, follow the steps shown below. Sample meal 3 oz (85 g) chicken breast. ? cup or 4 oz (106 g) brown rice.  cup or 3 oz (85 g) corn. 1 cup or 8 fl oz (237 mL) milk. 1 cup or 5 oz (150 g) strawberries with sugar-free whipped topping. Carbohydrate calculation Identify the foods that contain carbohydrates: Rice. Corn. Milk. Strawberries. Calculate how many servings you have   of each food: 2 servings rice. 1 serving corn. 1 serving milk. 1 serving strawberries. Multiply each number of servings by 15 g: 2 servings rice x 15 g = 30 g. 1 serving corn x 15 g = 15 g. 1 serving milk x 15 g = 15 g. 1 serving strawberries x 15 g = 15 g. Add together all of the amounts to find the total grams of carbohydrates eaten: 30 g + 15 g + 15 g + 15 g = 75 g of carbohydrates total. What are tips for following this plan? Shopping Develop a meal plan and then make a shopping list. Buy fresh and frozen vegetables, fresh and frozen fruit, dairy, eggs, beans, lentils, and whole grains. Look at food labels. Choose foods that have more fiber and less sugar. Avoid processed foods and foods with added sugars. Meal planning Aim to have the same  number of grams of carbohydrates at each meal and for each snack time. Plan to have regular, balanced meals and snacks. Where to find more information American Diabetes Association: diabetes.org Centers for Disease Control and Prevention: cdc.gov Academy of Nutrition and Dietetics: eatright.org Association of Diabetes Care & Education Specialists: diabeteseducator.org Summary Carbohydrate counting is a method of keeping track of how many carbohydrates you eat. Eating carbohydrates increases the amount of sugar (glucose) in your blood. Counting how many carbohydrates you eat improves how well you manage your blood glucose. This helps you manage your diabetes. A dietitian can help you make a meal plan and calculate how many carbohydrates you should have at each meal and snack. This information is not intended to replace advice given to you by your health care provider. Make sure you discuss any questions you have with your health care provider. Document Revised: 01/25/2020 Document Reviewed: 01/25/2020 Elsevier Patient Education  2023 Elsevier Inc.  

## 2022-04-10 NOTE — Progress Notes (Signed)
Established Patient Office Visit  Subjective   Patient ID: Larry Hardman., male    DOB: 03/26/1961  Age: 61 y.o. MRN: [id number]  Chief Complaint  Patient presents with   Follow-up    HPI  Larry Strother. Is a 61 y/o male who has history of hypoglycemia who presents for follow up visit and lab review. His lab done on 04/02/22 , total cholesterol increased to 238 mg/dl and LDL increased from 127 mg/dl to 157 mg/dl and HgbA1c increased from 5.8% to 6%. He states that he is compliant with his medications, denies chest pain and continues to work on making healthy choices. He states that his breathing is stable, but continues to smoke 1 pack of cigarette daily and admits the desire to quit.Overall, he states that he's doing well and offers no further complaint.  Review of Systems  Constitutional: Negative.   Eyes: Negative.   Respiratory: Negative.    Cardiovascular: Negative.   Neurological: Negative.   Psychiatric/Behavioral: Negative.        Objective:     BP 125/79   Pulse 71   Temp 97.8 F (36.6 C) (Oral)   Wt 127 lb (57.6 kg)   SpO2 97%   BMI 18.75 kg/m  BP Readings from Last 3 Encounters:  04/10/22 125/79  04/02/22 119/79  01/09/22 108/72   Wt Readings from Last 3 Encounters:  04/10/22 127 lb (57.6 kg)  04/02/22 127 lb 3.2 oz (57.7 kg)  01/09/22 124 lb 4.8 oz (56.4 kg)      Physical Exam HENT:     Head: Normocephalic and atraumatic.     Mouth/Throat:     Mouth: Mucous membranes are moist.  Eyes:     Extraocular Movements: Extraocular movements intact.     Conjunctiva/sclera: Conjunctivae normal.     Pupils: Pupils are equal, round, and reactive to light.  Cardiovascular:     Rate and Rhythm: Normal rate and regular rhythm.     Pulses: Normal pulses.     Heart sounds: Normal heart sounds.  Pulmonary:     Effort: Pulmonary effort is normal.     Breath sounds: Normal breath sounds.  Neurological:     General: No focal deficit present.      Mental Status: He is alert and oriented to person, place, and time. Mental status is at baseline.  Psychiatric:        Mood and Affect: Mood normal.        Behavior: Behavior normal.        Thought Content: Thought content normal.        Judgment: Judgment normal.      No results found for any visits on 04/10/22.  Last CBC Lab Results  Component Value Date   WBC 4.8 12/11/2021   HGB 13.8 12/11/2021   HCT 40.1 12/11/2021   MCV 90 12/11/2021   MCH 31.0 12/11/2021   RDW 11.8 12/11/2021   PLT 290 16/83/7290   Last metabolic panel Lab Results  Component Value Date   GLUCOSE 92 12/11/2021   NA 138 12/11/2021   K 4.5 12/11/2021   CL 100 12/11/2021   CO2 24 12/11/2021   BUN 13 12/11/2021   CREATININE 0.92 12/11/2021   EGFR 96 12/11/2021   CALCIUM 10.0 12/11/2021   PROT 6.9 12/11/2021   ALBUMIN 4.6 12/11/2021   LABGLOB 2.3 12/11/2021   AGRATIO 2.0 12/11/2021   BILITOT 0.3 12/11/2021   ALKPHOS 70 12/11/2021   AST 21 12/11/2021  ALT 18 12/11/2021   ANIONGAP 10 08/16/2019   Last lipids Lab Results  Component Value Date   CHOL 238 (H) 04/02/2022   HDL 56 04/02/2022   LDLCALC 157 (H) 04/02/2022   TRIG 138 04/02/2022   CHOLHDL 4.3 04/02/2022   Last hemoglobin A1c Lab Results  Component Value Date   HGBA1C 6.0 (H) 04/02/2022      The 10-year ASCVD risk score (Arnett DK, et al., 2019) is: 14.1%    Assessment & Plan:   1. Smoking - He was strongly encouraged on smoking cessation, declines [place] Quitline information states that he will work on cutting back  2. Elevated lipids - His ASCVD was 14.1%, he was started on Atorvastatin, educated on medication side effects and continue on low fat/cholesterol diet and exercise as tolerated. - atorvastatin (LIPITOR) 10 MG tablet; Take 1 tablet (10 mg total) by mouth daily.  Dispense: 30 tablet; Refill: 0  3. Pre-diabetes - His HgbA1c was 6%, was started on metformin, educated on medication side effects and advised to  notify clinic. - metFORMIN (GLUCOPHAGE) 500 MG tablet; Take 1 tablet (500 mg total) by mouth daily with breakfast.  Dispense: 500 tablet; Refill: 0  4. History of COPD - His breathing is stable, will continue on Breo until Grant Ruts is available at the Pharmacy. - fluticasone furoate-vilanterol (BREO ELLIPTA) 200-25 MCG/ACT AEPB; Inhale 1 puff into the lungs daily.  Dispense: 60 each; Refill: 2   Return in about 1 month (around 05/13/2022), or if symptoms worsen or fail to improve.    Larry Lynch Jerold Coombe, NP

## 2022-04-14 ENCOUNTER — Other Ambulatory Visit: Payer: Self-pay

## 2022-04-25 ENCOUNTER — Other Ambulatory Visit: Payer: Self-pay

## 2022-04-25 MED ORDER — FLUTICASONE-SALMETEROL 250-50 MCG/ACT IN AEPB
INHALATION_SPRAY | RESPIRATORY_TRACT | 3 refills | Status: AC
  Filled 2022-08-06 (×2): qty 180, fill #0

## 2022-05-01 ENCOUNTER — Other Ambulatory Visit: Payer: Self-pay

## 2022-05-05 ENCOUNTER — Other Ambulatory Visit: Payer: Self-pay

## 2022-05-05 ENCOUNTER — Other Ambulatory Visit: Payer: Self-pay | Admitting: Gerontology

## 2022-05-05 DIAGNOSIS — E785 Hyperlipidemia, unspecified: Secondary | ICD-10-CM

## 2022-05-05 DIAGNOSIS — R7303 Prediabetes: Secondary | ICD-10-CM

## 2022-05-05 MED ORDER — METFORMIN HCL 500 MG PO TABS
500.0000 mg | ORAL_TABLET | Freq: Every day | ORAL | 0 refills | Status: DC
  Filled 2022-05-05: qty 30, 30d supply, fill #0

## 2022-05-05 MED ORDER — ATORVASTATIN CALCIUM 10 MG PO TABS
10.0000 mg | ORAL_TABLET | Freq: Every day | ORAL | 0 refills | Status: DC
  Filled 2022-05-05: qty 30, 30d supply, fill #0

## 2022-05-06 ENCOUNTER — Other Ambulatory Visit: Payer: Self-pay

## 2022-05-13 ENCOUNTER — Other Ambulatory Visit: Payer: Self-pay

## 2022-05-13 ENCOUNTER — Ambulatory Visit: Payer: Medicaid Other | Admitting: Gerontology

## 2022-05-13 ENCOUNTER — Encounter: Payer: Self-pay | Admitting: Gerontology

## 2022-05-13 VITALS — BP 105/69 | HR 91 | Temp 98.3°F | Resp 17 | Ht 69.5 in | Wt 129.6 lb

## 2022-05-13 DIAGNOSIS — E785 Hyperlipidemia, unspecified: Secondary | ICD-10-CM

## 2022-05-13 DIAGNOSIS — R7303 Prediabetes: Secondary | ICD-10-CM

## 2022-05-13 DIAGNOSIS — K0889 Other specified disorders of teeth and supporting structures: Secondary | ICD-10-CM

## 2022-05-13 MED ORDER — METFORMIN HCL 500 MG PO TABS
500.0000 mg | ORAL_TABLET | Freq: Every day | ORAL | 1 refills | Status: DC
  Filled 2022-05-13: qty 90, 90d supply, fill #0

## 2022-05-13 MED ORDER — ATORVASTATIN CALCIUM 10 MG PO TABS
10.0000 mg | ORAL_TABLET | Freq: Every day | ORAL | 1 refills | Status: DC
  Filled 2022-05-13: qty 90, 90d supply, fill #0

## 2022-05-13 NOTE — Progress Notes (Signed)
Established Patient Office Visit  Subjective   Patient ID: Larry Dieudonne., male    DOB: 10-19-1960  Age: 61 y.o. MRN: [id number]  Chief Complaint  Patient presents with   Follow-up    HPI  Larry Lundahl. is a 61 y/o male who has history of hypoglycemia, elevated lipids, and prediabetes who presents for follow up visit after starting metformin and atorvastatin on 04/10/22. He states he is taking his medications as prescribed and is not experiencing any adverse effects. He also is experiencing 10/10 aching pain in his teeth. He states that this is a constant and generalized pain and it occurs regularly. He sometimes has difficulty eating due to his tooth pain. Otherwise, he states that his mood is good and he offers no further complaints.  Review of Systems  Constitutional: Negative.   HENT: Negative.         Generalized tooth pain  Eyes: Negative.   Respiratory: Negative.    Cardiovascular: Negative.   Gastrointestinal: Negative.   Genitourinary: Negative.   Musculoskeletal: Negative.   Neurological: Negative.   Endo/Heme/Allergies: Negative.   Psychiatric/Behavioral: Negative.        Objective:     BP 105/69 (BP Location: Right Arm, Patient Position: Sitting, Cuff Size: Large)   Pulse 91   Temp 98.3 F (36.8 C) (Oral)   Resp 17   Ht 5' 9.5" (1.765 m)   Wt 129 lb 9.6 oz (58.8 kg)   SpO2 96%   BMI 18.86 kg/m  BP Readings from Last 3 Encounters:  05/13/22 105/69  04/10/22 125/79  04/02/22 119/79   Wt Readings from Last 3 Encounters:  05/13/22 129 lb 9.6 oz (58.8 kg)  04/10/22 127 lb (57.6 kg)  04/02/22 127 lb 3.2 oz (57.7 kg)      Physical Exam Constitutional:      Appearance: Normal appearance.  HENT:     Head: Normocephalic and atraumatic.     Mouth/Throat:     Mouth: Mucous membranes are moist.     Dentition: Abnormal dentition. Dental caries present. No gingival swelling.  Eyes:     Conjunctiva/sclera: Conjunctivae normal.   Cardiovascular:     Rate and Rhythm: Normal rate and regular rhythm.     Heart sounds: Normal heart sounds.  Pulmonary:     Effort: Pulmonary effort is normal.     Breath sounds: Normal breath sounds.  Neurological:     General: No focal deficit present.     Mental Status: He is alert. Mental status is at baseline.  Psychiatric:        Mood and Affect: Mood normal.        Behavior: Behavior normal.        Thought Content: Thought content normal.        Judgment: Judgment normal.      No results found for any visits on 05/13/22.  Last CBC Lab Results  Component Value Date   WBC 4.8 12/11/2021   HGB 13.8 12/11/2021   HCT 40.1 12/11/2021   MCV 90 12/11/2021   MCH 31.0 12/11/2021   RDW 11.8 12/11/2021   PLT 290 80/32/1224   Last metabolic panel Lab Results  Component Value Date   GLUCOSE 92 12/11/2021   NA 138 12/11/2021   K 4.5 12/11/2021   CL 100 12/11/2021   CO2 24 12/11/2021   BUN 13 12/11/2021   CREATININE 0.92 12/11/2021   EGFR 96 12/11/2021   CALCIUM 10.0 12/11/2021   PROT 6.9  12/11/2021   ALBUMIN 4.6 12/11/2021   LABGLOB 2.3 12/11/2021   AGRATIO 2.0 12/11/2021   BILITOT 0.3 12/11/2021   ALKPHOS 70 12/11/2021   AST 21 12/11/2021   ALT 18 12/11/2021   ANIONGAP 10 08/16/2019   Last lipids Lab Results  Component Value Date   CHOL 238 (H) 04/02/2022   HDL 56 04/02/2022   LDLCALC 157 (H) 04/02/2022   TRIG 138 04/02/2022   CHOLHDL 4.3 04/02/2022   Last hemoglobin A1c Lab Results  Component Value Date   HGBA1C 6.0 (H) 04/02/2022      The 10-year ASCVD risk score (Arnett DK, et al., 2019) is: 10.6%    Assessment & Plan:   Pre-diabetes - Metformin to be refilled and continued at current dose, as he is tolerating it well. Low carb/non-concentrated sweet diet as tolerated. - Repeat A1c to be drawn 08/06/22 to reassess prediabetes. - metFORMIN (GLUCOPHAGE) 500 MG tablet; Take 1 tablet (500 mg total) by mouth daily with breakfast.  Dispense: 90  tablet; Refill: 1   Elevated lipids - Atorvastatin to be refilled and continued at current dose, as he is tolerating it well. He was advised to continue on low fat/ cholesterol diet and exercise as tolerated. - Repeat lipid panel to be drawn 08/06/22 to reassess hyperlipidemia. - atorvastatin (LIPITOR) 10 MG tablet; Take 1 tablet (10 mg total) by mouth daily.  Dispense: 90 tablet; Refill: 1   Tooth pain - Dental application provided for tooth pain.  - OTC pain management with ibuprofen encouraged. - He was instructed to call the clinic if he experiences swelling or redness in his gums.   Return in about 3 months (around 08/13/2022), or if symptoms worsen or fail to improve.    Odelia Gage

## 2022-05-13 NOTE — Patient Instructions (Signed)

## 2022-06-19 ENCOUNTER — Other Ambulatory Visit: Payer: Self-pay

## 2022-07-31 ENCOUNTER — Other Ambulatory Visit: Payer: Self-pay

## 2022-08-06 ENCOUNTER — Other Ambulatory Visit: Payer: Medicaid Other

## 2022-08-06 ENCOUNTER — Other Ambulatory Visit: Payer: Self-pay

## 2022-08-06 DIAGNOSIS — E785 Hyperlipidemia, unspecified: Secondary | ICD-10-CM

## 2022-08-06 DIAGNOSIS — R7303 Prediabetes: Secondary | ICD-10-CM

## 2022-08-06 MED ORDER — METFORMIN HCL 500 MG PO TABS
500.0000 mg | ORAL_TABLET | Freq: Every day | ORAL | 1 refills | Status: AC
  Filled 2022-08-06: qty 90, 90d supply, fill #0

## 2022-08-06 MED ORDER — ATORVASTATIN CALCIUM 10 MG PO TABS
10.0000 mg | ORAL_TABLET | Freq: Every day | ORAL | 1 refills | Status: AC
  Filled 2022-08-06: qty 90, 90d supply, fill #0

## 2022-08-11 ENCOUNTER — Other Ambulatory Visit: Payer: Self-pay

## 2022-08-13 ENCOUNTER — Ambulatory Visit: Payer: Medicaid Other | Admitting: Gerontology

## 2022-08-27 ENCOUNTER — Other Ambulatory Visit: Payer: Self-pay

## 2022-09-12 ENCOUNTER — Other Ambulatory Visit: Payer: Self-pay
